# Patient Record
Sex: Male | Born: 1965 | Race: White | Hispanic: No | Marital: Married | Smoking: Never smoker
Health system: Southern US, Community
[De-identification: ages and names within clinical notes are randomized; demographics above are authoritative.]

## PROBLEM LIST (undated history)

## (undated) DIAGNOSIS — E785 Hyperlipidemia, unspecified: Secondary | ICD-10-CM

## (undated) DIAGNOSIS — H00019 Hordeolum externum unspecified eye, unspecified eyelid: Secondary | ICD-10-CM

## (undated) DIAGNOSIS — C4491 Basal cell carcinoma of skin, unspecified: Secondary | ICD-10-CM

## (undated) DIAGNOSIS — M419 Scoliosis, unspecified: Secondary | ICD-10-CM

## (undated) DIAGNOSIS — U071 COVID-19: Secondary | ICD-10-CM

## (undated) DIAGNOSIS — K649 Unspecified hemorrhoids: Secondary | ICD-10-CM

## (undated) DIAGNOSIS — T7840XA Allergy, unspecified, initial encounter: Secondary | ICD-10-CM

## (undated) HISTORY — PX: NASAL SINUS SURGERY: SHX719

## (undated) HISTORY — DX: Hyperlipidemia, unspecified: E78.5

## (undated) HISTORY — PX: FOOT SURGERY: SHX648

## (undated) HISTORY — DX: Hordeolum externum unspecified eye, unspecified eyelid: H00.019

## (undated) HISTORY — PX: HEMORRHOID SURGERY: SHX153

## (undated) HISTORY — DX: Allergy, unspecified, initial encounter: T78.40XA

## (undated) HISTORY — DX: Scoliosis, unspecified: M41.9

## (undated) HISTORY — DX: Unspecified hemorrhoids: K64.9

## (undated) HISTORY — DX: Basal cell carcinoma of skin, unspecified: C44.91

## (undated) HISTORY — DX: COVID-19: U07.1

---

## 2006-11-21 ENCOUNTER — Ambulatory Visit: Payer: Self-pay | Admitting: Surgery

## 2010-06-22 ENCOUNTER — Ambulatory Visit: Payer: Self-pay | Admitting: Otolaryngology

## 2012-05-14 DIAGNOSIS — D239 Other benign neoplasm of skin, unspecified: Secondary | ICD-10-CM

## 2012-05-14 HISTORY — DX: Other benign neoplasm of skin, unspecified: D23.9

## 2013-02-13 ENCOUNTER — Ambulatory Visit (INDEPENDENT_AMBULATORY_CARE_PROVIDER_SITE_OTHER): Payer: BC Managed Care – PPO | Admitting: Podiatry

## 2013-02-13 ENCOUNTER — Encounter: Payer: Self-pay | Admitting: Podiatry

## 2013-02-13 ENCOUNTER — Ambulatory Visit (INDEPENDENT_AMBULATORY_CARE_PROVIDER_SITE_OTHER): Payer: BC Managed Care – PPO

## 2013-02-13 VITALS — BP 123/85 | HR 73 | Resp 16 | Ht 73.0 in | Wt 190.0 lb

## 2013-02-13 DIAGNOSIS — M79609 Pain in unspecified limb: Secondary | ICD-10-CM

## 2013-02-13 DIAGNOSIS — M79671 Pain in right foot: Secondary | ICD-10-CM

## 2013-02-13 DIAGNOSIS — G588 Other specified mononeuropathies: Secondary | ICD-10-CM

## 2013-02-13 DIAGNOSIS — G5760 Lesion of plantar nerve, unspecified lower limb: Secondary | ICD-10-CM

## 2013-02-13 NOTE — Progress Notes (Signed)
   Subjective:    Patient ID: Kevin Ward, male    DOB: Jan 27, 1965, 48 y.o.   MRN: [id number]  HPI Comments: Been dealing with the neuroma in the right foot for some time now and would like to discuss options with dr Milinda Pointer , last seen was 10.17.2013      Review of Systems     Objective:   Physical Exam: I have reviewed his past medical history medications allergies surgeries and social history. Pulses are palpable right lower extremity. He has palpable Mulder's click to the third interdigital space of the right foot. No changes in his radiographs of the past. Normal osseous architecture.        Assessment & Plan:  Assessment: Neuroma third interdigital space right foot.  Plan: Discussed etiology pathology conservative versus surgical therapies. At this point we consented him today for an excision neuroma third interdigital space of the right foot. We went over consent form line bylined number by number giving him ample time to ask questions he saw for regarding this procedure I answered them to the best of my ability in layman's terms. He understands that he still may have pain postoperatively.

## 2013-03-15 ENCOUNTER — Encounter: Payer: Self-pay | Admitting: Podiatry

## 2013-03-15 DIAGNOSIS — G5760 Lesion of plantar nerve, unspecified lower limb: Secondary | ICD-10-CM

## 2013-03-18 ENCOUNTER — Telehealth: Payer: Self-pay | Admitting: *Deleted

## 2013-03-18 NOTE — Progress Notes (Signed)
1. Neurectomy 3rd (ids) interdigital space right foot  rx  Percocet 10/325 #50 0 refills one to two by mouth every 6 - 8 hrs as needed for pain Phenergan 25 mg #30 0 refills one by mouth every 6 - 8 hrs as needed for nausea Keflex 500 mg #20 0 refills one by mouth two times daily

## 2013-03-18 NOTE — Telephone Encounter (Signed)
Called and spoke with pt regarding surgery on 2.27.15. Pt states he is staying off of foot, elevating and icing also is taking all of his rx that dr Milinda Pointer wrote for. Told pt to continue doing what he is doing and we will see him this Thursday for his first post op visit. Pt understood.

## 2013-03-21 ENCOUNTER — Encounter: Payer: Self-pay | Admitting: Podiatry

## 2013-03-21 ENCOUNTER — Ambulatory Visit (INDEPENDENT_AMBULATORY_CARE_PROVIDER_SITE_OTHER): Payer: BC Managed Care – PPO | Admitting: Podiatry

## 2013-03-21 VITALS — BP 69/54 | HR 60 | Resp 16

## 2013-03-21 DIAGNOSIS — Z9889 Other specified postprocedural states: Secondary | ICD-10-CM

## 2013-03-21 DIAGNOSIS — G588 Other specified mononeuropathies: Secondary | ICD-10-CM

## 2013-03-21 DIAGNOSIS — G5760 Lesion of plantar nerve, unspecified lower limb: Secondary | ICD-10-CM

## 2013-03-21 NOTE — Progress Notes (Signed)
Post op 2.2715 right foot neurectomy. Pt states it is sore.  Objective: Vital signs are stable he is alert and oriented x3. He denies fever chills nausea vomiting muscle aches and pains. Dry sterile dressing was intact once removed demonstrates mild erythema around a incision overlying the third interdigital space of the right foot. The incision appears to be well coapted with the sutures are intact. He has minimal tenderness on range of motion of the toes #3 and #4 of the right foot. He has some numbness on palpation of the plantar surface of the foot and the toes do not appear to be numb to him. His pathology did come back today positive for neuroma.  Assessment: Status post 1 week excision neuroma third interdigital space of the right foot.  Plan: Redressed today with a dry sterile compressive dressing encouraged and remain in the [place] shoe and I will followup with him in one week for suture removal.

## 2013-03-28 ENCOUNTER — Ambulatory Visit: Payer: BC Managed Care – PPO | Admitting: Podiatry

## 2013-03-28 VITALS — BP 128/81 | HR 82 | Resp 16 | Ht 73.0 in | Wt 192.0 lb

## 2013-03-28 DIAGNOSIS — M79609 Pain in unspecified limb: Secondary | ICD-10-CM

## 2013-03-28 DIAGNOSIS — G588 Other specified mononeuropathies: Secondary | ICD-10-CM

## 2013-03-28 DIAGNOSIS — Z9889 Other specified postprocedural states: Secondary | ICD-10-CM

## 2013-03-28 DIAGNOSIS — G5760 Lesion of plantar nerve, unspecified lower limb: Secondary | ICD-10-CM

## 2013-03-29 NOTE — Progress Notes (Signed)
He presents today 2 weeks status post surgical neurectomy third interdigital space of the right foot. He denies fever chills nausea vomiting muscle aches and pains states it is doing pretty good.  Objective: Vital signs are stable he is alert and oriented x3 today sutures were removed there is no dehiscence is no signs of infection.  Assessment: Well-healing surgical foot right.  Plan: I would allow him to start getting this wet and into a regular shoe I will followup with him in 2 weeks.

## 2013-04-11 ENCOUNTER — Ambulatory Visit (INDEPENDENT_AMBULATORY_CARE_PROVIDER_SITE_OTHER): Payer: BC Managed Care – PPO | Admitting: Podiatry

## 2013-04-11 ENCOUNTER — Encounter: Payer: Self-pay | Admitting: Podiatry

## 2013-04-11 VITALS — BP 157/77 | HR 87 | Resp 14 | Ht 73.0 in | Wt 192.0 lb

## 2013-04-11 DIAGNOSIS — Z9889 Other specified postprocedural states: Secondary | ICD-10-CM

## 2013-04-11 DIAGNOSIS — G588 Other specified mononeuropathies: Secondary | ICD-10-CM

## 2013-04-11 DIAGNOSIS — G5760 Lesion of plantar nerve, unspecified lower limb: Secondary | ICD-10-CM

## 2013-04-11 NOTE — Progress Notes (Signed)
He presents today status post neurectomy third interdigital space of the right foot. This took place 4 weeks ago. He denies fever chills nausea vomiting muscle aches and pains states it really feels good.  Objective: Vital signs are stable he is alert and oriented x3. There is no erythema edema cellulitis drainage or odor. No tenderness on palpation.  Assessment: Well-healing surgical neurectomy third interdigital space right foot.  Plan: Discussed etiology pathology conservative versus surgical therapies I am going to release him from surgical care.

## 2013-04-22 ENCOUNTER — Encounter: Payer: Self-pay | Admitting: Podiatry

## 2014-04-07 ENCOUNTER — Ambulatory Visit: Payer: Self-pay | Admitting: Physician Assistant

## 2015-02-09 LAB — HM HIV SCREENING LAB: HM HIV Screening: NEGATIVE

## 2015-02-09 LAB — HM HEPATITIS C SCREENING LAB: HM Hepatitis Screen: NEGATIVE

## 2016-03-25 LAB — HM COLONOSCOPY

## 2016-06-21 DIAGNOSIS — H52203 Unspecified astigmatism, bilateral: Secondary | ICD-10-CM | POA: Diagnosis not present

## 2016-11-19 DIAGNOSIS — Z23 Encounter for immunization: Secondary | ICD-10-CM | POA: Diagnosis not present

## 2017-02-25 DIAGNOSIS — J019 Acute sinusitis, unspecified: Secondary | ICD-10-CM | POA: Diagnosis not present

## 2017-03-30 DIAGNOSIS — H52203 Unspecified astigmatism, bilateral: Secondary | ICD-10-CM | POA: Diagnosis not present

## 2017-08-15 DIAGNOSIS — M9903 Segmental and somatic dysfunction of lumbar region: Secondary | ICD-10-CM | POA: Diagnosis not present

## 2017-08-15 DIAGNOSIS — M9901 Segmental and somatic dysfunction of cervical region: Secondary | ICD-10-CM | POA: Diagnosis not present

## 2017-08-15 DIAGNOSIS — M9902 Segmental and somatic dysfunction of thoracic region: Secondary | ICD-10-CM | POA: Diagnosis not present

## 2017-08-15 DIAGNOSIS — M5412 Radiculopathy, cervical region: Secondary | ICD-10-CM | POA: Diagnosis not present

## 2017-09-22 DIAGNOSIS — M5412 Radiculopathy, cervical region: Secondary | ICD-10-CM | POA: Diagnosis not present

## 2017-09-22 DIAGNOSIS — M9902 Segmental and somatic dysfunction of thoracic region: Secondary | ICD-10-CM | POA: Diagnosis not present

## 2017-09-22 DIAGNOSIS — M9901 Segmental and somatic dysfunction of cervical region: Secondary | ICD-10-CM | POA: Diagnosis not present

## 2017-09-22 DIAGNOSIS — M9903 Segmental and somatic dysfunction of lumbar region: Secondary | ICD-10-CM | POA: Diagnosis not present

## 2017-10-12 DIAGNOSIS — M5412 Radiculopathy, cervical region: Secondary | ICD-10-CM | POA: Diagnosis not present

## 2017-10-12 DIAGNOSIS — M9903 Segmental and somatic dysfunction of lumbar region: Secondary | ICD-10-CM | POA: Diagnosis not present

## 2017-10-12 DIAGNOSIS — M9902 Segmental and somatic dysfunction of thoracic region: Secondary | ICD-10-CM | POA: Diagnosis not present

## 2017-10-12 DIAGNOSIS — M9901 Segmental and somatic dysfunction of cervical region: Secondary | ICD-10-CM | POA: Diagnosis not present

## 2017-10-20 DIAGNOSIS — M9902 Segmental and somatic dysfunction of thoracic region: Secondary | ICD-10-CM | POA: Diagnosis not present

## 2017-10-20 DIAGNOSIS — M9901 Segmental and somatic dysfunction of cervical region: Secondary | ICD-10-CM | POA: Diagnosis not present

## 2017-10-20 DIAGNOSIS — M5412 Radiculopathy, cervical region: Secondary | ICD-10-CM | POA: Diagnosis not present

## 2017-10-20 DIAGNOSIS — M9903 Segmental and somatic dysfunction of lumbar region: Secondary | ICD-10-CM | POA: Diagnosis not present

## 2017-11-03 DIAGNOSIS — M9902 Segmental and somatic dysfunction of thoracic region: Secondary | ICD-10-CM | POA: Diagnosis not present

## 2017-11-03 DIAGNOSIS — M9901 Segmental and somatic dysfunction of cervical region: Secondary | ICD-10-CM | POA: Diagnosis not present

## 2017-11-03 DIAGNOSIS — M5412 Radiculopathy, cervical region: Secondary | ICD-10-CM | POA: Diagnosis not present

## 2017-11-03 DIAGNOSIS — M9903 Segmental and somatic dysfunction of lumbar region: Secondary | ICD-10-CM | POA: Diagnosis not present

## 2017-11-17 DIAGNOSIS — M9902 Segmental and somatic dysfunction of thoracic region: Secondary | ICD-10-CM | POA: Diagnosis not present

## 2017-11-17 DIAGNOSIS — M9903 Segmental and somatic dysfunction of lumbar region: Secondary | ICD-10-CM | POA: Diagnosis not present

## 2017-11-17 DIAGNOSIS — M9901 Segmental and somatic dysfunction of cervical region: Secondary | ICD-10-CM | POA: Diagnosis not present

## 2017-11-17 DIAGNOSIS — M5412 Radiculopathy, cervical region: Secondary | ICD-10-CM | POA: Diagnosis not present

## 2017-12-22 DIAGNOSIS — M5412 Radiculopathy, cervical region: Secondary | ICD-10-CM | POA: Diagnosis not present

## 2017-12-22 DIAGNOSIS — M9902 Segmental and somatic dysfunction of thoracic region: Secondary | ICD-10-CM | POA: Diagnosis not present

## 2017-12-22 DIAGNOSIS — M9903 Segmental and somatic dysfunction of lumbar region: Secondary | ICD-10-CM | POA: Diagnosis not present

## 2017-12-22 DIAGNOSIS — M9901 Segmental and somatic dysfunction of cervical region: Secondary | ICD-10-CM | POA: Diagnosis not present

## 2018-01-19 DIAGNOSIS — M9903 Segmental and somatic dysfunction of lumbar region: Secondary | ICD-10-CM | POA: Diagnosis not present

## 2018-01-19 DIAGNOSIS — M9902 Segmental and somatic dysfunction of thoracic region: Secondary | ICD-10-CM | POA: Diagnosis not present

## 2018-01-19 DIAGNOSIS — M5412 Radiculopathy, cervical region: Secondary | ICD-10-CM | POA: Diagnosis not present

## 2018-01-19 DIAGNOSIS — M9901 Segmental and somatic dysfunction of cervical region: Secondary | ICD-10-CM | POA: Diagnosis not present

## 2018-02-12 DIAGNOSIS — M5412 Radiculopathy, cervical region: Secondary | ICD-10-CM | POA: Diagnosis not present

## 2018-02-12 DIAGNOSIS — M9902 Segmental and somatic dysfunction of thoracic region: Secondary | ICD-10-CM | POA: Diagnosis not present

## 2018-02-12 DIAGNOSIS — M9903 Segmental and somatic dysfunction of lumbar region: Secondary | ICD-10-CM | POA: Diagnosis not present

## 2018-02-12 DIAGNOSIS — M9901 Segmental and somatic dysfunction of cervical region: Secondary | ICD-10-CM | POA: Diagnosis not present

## 2018-03-14 ENCOUNTER — Ambulatory Visit: Payer: BLUE CROSS/BLUE SHIELD | Admitting: Internal Medicine

## 2018-03-14 ENCOUNTER — Encounter: Payer: Self-pay | Admitting: Internal Medicine

## 2018-03-14 VITALS — BP 128/82 | HR 63 | Temp 98.3°F | Ht 73.0 in | Wt 199.0 lb

## 2018-03-14 DIAGNOSIS — Z0000 Encounter for general adult medical examination without abnormal findings: Secondary | ICD-10-CM | POA: Diagnosis not present

## 2018-03-14 DIAGNOSIS — E785 Hyperlipidemia, unspecified: Secondary | ICD-10-CM | POA: Diagnosis not present

## 2018-03-14 DIAGNOSIS — K148 Other diseases of tongue: Secondary | ICD-10-CM

## 2018-03-14 DIAGNOSIS — Z125 Encounter for screening for malignant neoplasm of prostate: Secondary | ICD-10-CM

## 2018-03-14 DIAGNOSIS — Z1159 Encounter for screening for other viral diseases: Secondary | ICD-10-CM

## 2018-03-14 DIAGNOSIS — K219 Gastro-esophageal reflux disease without esophagitis: Secondary | ICD-10-CM

## 2018-03-14 DIAGNOSIS — E559 Vitamin D deficiency, unspecified: Secondary | ICD-10-CM

## 2018-03-14 DIAGNOSIS — Z85828 Personal history of other malignant neoplasm of skin: Secondary | ICD-10-CM

## 2018-03-14 DIAGNOSIS — Z1389 Encounter for screening for other disorder: Secondary | ICD-10-CM

## 2018-03-14 DIAGNOSIS — Z1329 Encounter for screening for other suspected endocrine disorder: Secondary | ICD-10-CM | POA: Diagnosis not present

## 2018-03-14 DIAGNOSIS — Z1283 Encounter for screening for malignant neoplasm of skin: Secondary | ICD-10-CM | POA: Diagnosis not present

## 2018-03-14 DIAGNOSIS — Z23 Encounter for immunization: Secondary | ICD-10-CM

## 2018-03-14 DIAGNOSIS — Z0184 Encounter for antibody response examination: Secondary | ICD-10-CM

## 2018-03-14 HISTORY — DX: Personal history of other malignant neoplasm of skin: Z85.828

## 2018-03-14 LAB — CBC WITH DIFFERENTIAL/PLATELET
Basophils Absolute: 0.1 10*3/uL (ref 0.0–0.1)
Basophils Relative: 0.8 % (ref 0.0–3.0)
Eosinophils Absolute: 0.1 10*3/uL (ref 0.0–0.7)
Eosinophils Relative: 2.2 % (ref 0.0–5.0)
HCT: 43.6 % (ref 39.0–52.0)
Hemoglobin: 15.1 g/dL (ref 13.0–17.0)
Lymphocytes Relative: 32.3 % (ref 12.0–46.0)
Lymphs Abs: 2.2 10*3/uL (ref 0.7–4.0)
MCHC: 34.7 g/dL (ref 30.0–36.0)
MCV: 88.1 fl (ref 78.0–100.0)
Monocytes Absolute: 0.5 10*3/uL (ref 0.1–1.0)
Monocytes Relative: 7.8 % (ref 3.0–12.0)
Neutro Abs: 3.8 10*3/uL (ref 1.4–7.7)
Neutrophils Relative %: 56.9 % (ref 43.0–77.0)
Platelets: 216 10*3/uL (ref 150.0–400.0)
RBC: 4.95 Mil/uL (ref 4.22–5.81)
RDW: 12.9 % (ref 11.5–15.5)
WBC: 6.7 10*3/uL (ref 4.0–10.5)

## 2018-03-14 LAB — LIPID PANEL
CHOL/HDL RATIO: 4
Cholesterol: 199 mg/dL (ref 0–200)
HDL: 45.7 mg/dL (ref >39.00)
LDL Cholesterol: 114 mg/dL — ABNORMAL HIGH (ref 0–99)
NONHDL: 152.95
Triglycerides: 197 mg/dL — ABNORMAL HIGH (ref 0.0–149.0)
VLDL: 39.4 mg/dL (ref 0.0–40.0)

## 2018-03-14 LAB — COMPREHENSIVE METABOLIC PANEL
ALT: 24 U/L (ref 0–53)
AST: 19 U/L (ref 0–37)
Albumin: 4.7 g/dL (ref 3.5–5.2)
Alkaline Phosphatase: 68 U/L (ref 39–117)
BILIRUBIN TOTAL: 0.6 mg/dL (ref 0.2–1.2)
BUN: 16 mg/dL (ref 6–23)
CO2: 31 mEq/L (ref 19–32)
Calcium: 10 mg/dL (ref 8.4–10.5)
Chloride: 101 mEq/L (ref 96–112)
Creatinine, Ser: 0.94 mg/dL (ref 0.40–1.50)
GFR: 84.22 mL/min (ref >60.00)
Glucose, Bld: 93 mg/dL (ref 70–99)
Potassium: 4.1 mEq/L (ref 3.5–5.1)
SODIUM: 140 meq/L (ref 135–145)
TOTAL PROTEIN: 7 g/dL (ref 6.0–8.3)

## 2018-03-14 LAB — URINALYSIS, ROUTINE W REFLEX MICROSCOPIC
Bilirubin Urine: NEGATIVE
Glucose, UA: NEGATIVE
Hgb urine dipstick: NEGATIVE
Ketones, ur: NEGATIVE
Leukocytes,Ua: NEGATIVE
Nitrite: NEGATIVE
Protein, ur: NEGATIVE
Specific Gravity, Urine: 1.011 (ref 1.001–1.03)
pH: 7 (ref 5.0–8.0)

## 2018-03-14 MED ORDER — PANTOPRAZOLE SODIUM 40 MG PO TBEC: 40.0000 mg | DELAYED_RELEASE_TABLET | Freq: Every day | ORAL | 1 refills | Status: DC

## 2018-03-14 NOTE — Patient Instructions (Addendum)
Shingrix vaccines   Cholesterol Cholesterol is a white, waxy, fat-like substance that is needed by the human body in small amounts. The liver makes all the cholesterol we need. Cholesterol is carried from the liver by the blood through the blood vessels. Deposits of cholesterol (plaques) may build up on blood vessel (artery) walls. Plaques make the arteries narrower and stiffer. Cholesterol plaques increase the risk for heart attack and stroke. You cannot feel your cholesterol level even if it is very high. The only way to know that it is high is to have a blood test. Once you know your cholesterol levels, you should keep a record of the test results. Work with your health care provider to keep your levels in the desired range. What do the results mean?  Total cholesterol is a rough measure of all the cholesterol in your blood.  LDL (low-density lipoprotein) is the "bad" cholesterol. This is the type that causes plaque to build up on the artery walls. You want this level to be low.  HDL (high-density lipoprotein) is the "good" cholesterol because it cleans the arteries and carries the LDL away. You want this level to be high.  Triglycerides are fat that the body can either burn for energy or store. High levels are closely linked to heart disease. What are the desired levels of cholesterol?  Total cholesterol below 200.  LDL below 100 for people who are at risk, below 70 for people at very high risk.  HDL above 40 is good. A level of 60 or higher is considered to be protective against heart disease.  Triglycerides below 150. How can I lower my cholesterol? Diet Follow your diet program as told by your health care provider.  Choose fish or white meat chicken and [place], roasted or baked. Limit fatty cuts of red meat, fried foods, and processed meats, such as sausage and lunch meats.  Eat lots of fresh fruits and vegetables.  Choose whole grains, beans, pasta, potatoes, and  cereals.  Choose olive oil, corn oil, or canola oil, and use only small amounts.  Avoid butter, mayonnaise, shortening, or palm kernel oils.  Avoid foods with trans fats.  Drink skim or nonfat milk and eat low-fat or nonfat yogurt and cheeses. Avoid whole milk, cream, ice cream, egg yolks, and full-fat cheeses.  Healthier desserts include angel food cake, ginger snaps, animal crackers, hard candy, popsicles, and low-fat or nonfat frozen yogurt. Avoid pastries, cakes, pies, and cookies.  Exercise  Follow your exercise program as told by your health care provider. A regular program: ? Helps to decrease LDL and raise HDL. ? Helps with weight control.  Do things that increase your activity level, such as gardening, walking, and taking the stairs.  Ask your health care provider about ways that you can be more active in your daily life. Medicine  Take over-the-counter and prescription medicines only as told by your health care provider. ? Medicine may be prescribed by your health care provider to help lower cholesterol and decrease the risk for heart disease. This is usually done if diet and exercise have failed to bring down cholesterol levels. ? If you have several risk factors, you may need medicine even if your levels are normal. This information is not intended to replace advice given to you by your health care provider. Make sure you discuss any questions you have with your health care provider. Document Released: 09/28/2000 Document Revised: 08/01/2015 Document Reviewed: 07/04/2015 Elsevier Interactive Patient Education  [employer].  Gastroesophageal Reflux Disease, Adult Gastroesophageal reflux (GER) happens when acid from the stomach flows up into the tube that connects the mouth and the stomach (esophagus). Normally, food travels down the esophagus and stays in the stomach to be digested. However, when a person has GER, food and stomach acid sometimes move back up into  the esophagus. If this becomes a more serious problem, the person may be diagnosed with a disease called gastroesophageal reflux disease (GERD). GERD occurs when the reflux:  Happens often.  Causes frequent or severe symptoms.  Causes problems such as damage to the esophagus. When stomach acid comes in contact with the esophagus, the acid may cause soreness (inflammation) in the esophagus. Over time, GERD may create small holes (ulcers) in the lining of the esophagus. What are the causes? This condition is caused by a problem with the muscle between the esophagus and the stomach (lower esophageal sphincter, or LES). Normally, the LES muscle closes after food passes through the esophagus to the stomach. When the LES is weakened or abnormal, it does not close properly, and that allows food and stomach acid to go back up into the esophagus. The LES can be weakened by certain dietary substances, medicines, and medical conditions, including:  Tobacco use.  Pregnancy.  Having a hiatal hernia.  Alcohol use.  Certain foods and beverages, such as coffee, chocolate, onions, and peppermint. What increases the risk? You are more likely to develop this condition if you:  Have an increased body weight.  Have a connective tissue disorder.  Use NSAID medicines. What are the signs or symptoms? Symptoms of this condition include:  Heartburn.  Difficult or painful swallowing.  The feeling of having a lump in the throat.  Abitter taste in the mouth.  Bad breath.  Having a large amount of saliva.  Having an upset or bloated stomach.  Belching.  Chest pain. Different conditions can cause chest pain. Make sure you see your health care provider if you experience chest pain.  Shortness of breath or wheezing.  Ongoing (chronic) cough or a night-time cough.  Wearing away of tooth enamel.  Weight loss. How is this diagnosed? Your health care provider will take a medical history and  perform a physical exam. To determine if you have mild or severe GERD, your health care provider may also monitor how you respond to treatment. You may also have tests, including:  A test to examine your stomach and esophagus with a small camera (endoscopy).  A test thatmeasures the acidity level in your esophagus.  A test thatmeasures how much pressure is on your esophagus.  A barium swallow or modified barium swallow test to show the shape, size, and functioning of your esophagus. How is this treated? The goal of treatment is to help relieve your symptoms and to prevent complications. Treatment for this condition may vary depending on how severe your symptoms are. Your health care provider may recommend:  Changes to your diet.  Medicine.  Surgery. Follow these instructions at home: Eating and drinking   Follow a diet as recommended by your health care provider. This may involve avoiding foods and drinks such as: ? Coffee and tea (with or without caffeine). ? Drinks that containalcohol. ? Energy drinks and sports drinks. ? Carbonated drinks or sodas. ? Chocolate and cocoa. ? Peppermint and mint flavorings. ? Garlic and onions. ? Horseradish. ? Spicy and acidic foods, including peppers, chili powder, curry powder, vinegar, hot sauces, and barbecue sauce. ? Citrus fruit juices and  citrus fruits, such as oranges, lemons, and limes. ? Tomato-based foods, such as red sauce, chili, salsa, and pizza with red sauce. ? Fried and fatty foods, such as donuts, french fries, potato chips, and high-fat dressings. ? High-fat meats, such as hot dogs and fatty cuts of red and white meats, such as rib eye steak, sausage, ham, and bacon. ? High-fat dairy items, such as whole milk, butter, and cream cheese.  Eat small, frequent meals instead of large meals.  Avoid drinking large amounts of liquid with your meals.  Avoid eating meals during the 2-3 hours before bedtime.  Avoid lying down  right after you eat.  Do not exercise right after you eat. Lifestyle   Do not use any products that contain nicotine or tobacco, such as cigarettes, e-cigarettes, and chewing tobacco. If you need help quitting, ask your health care provider.  Try to reduce your stress by using methods such as yoga or meditation. If you need help reducing stress, ask your health care provider.  If you are overweight, reduce your weight to an amount that is healthy for you. Ask your health care provider for guidance about a safe weight loss goal. General instructions  Pay attention to any changes in your symptoms.  Take over-the-counter and prescription medicines only as told by your health care provider. Do not take aspirin, ibuprofen, or other NSAIDs unless your health care provider told you to do so.  Wear loose-fitting clothing. Do not wear anything tight around your waist that causes pressure on your abdomen.  Raise (elevate) the head of your bed about 6 inches (15 cm).  Avoid bending over if this makes your symptoms worse.  Keep all follow-up visits as told by your health care provider. This is important. Contact a health care provider if:  You have: ? New symptoms. ? Unexplained weight loss. ? Difficulty swallowing or it hurts to swallow. ? Wheezing or a persistent cough. ? A hoarse voice.  Your symptoms do not improve with treatment. Get help right away if you:  Have pain in your arms, neck, jaw, teeth, or back.  Feel sweaty, dizzy, or light-headed.  Have chest pain or shortness of breath.  Vomit and your vomit looks like blood or coffee grounds.  Faint.  Have stool that is bloody or black.  Cannot swallow, drink, or eat. Summary  Gastroesophageal reflux happens when acid from the stomach flows up into the esophagus. GERD is a disease in which the reflux happens often, causes frequent or severe symptoms, or causes problems such as damage to the esophagus.  Treatment for this  condition may vary depending on how severe your symptoms are. Your health care provider may recommend diet and lifestyle changes, medicine, or surgery.  Contact a health care provider if you have new or worsening symptoms.  Take over-the-counter and prescription medicines only as told by your health care provider. Do not take aspirin, ibuprofen, or other NSAIDs unless your health care provider told you to do so.  Keep all follow-up visits as told by your health care provider. This is important. This information is not intended to replace advice given to you by your health care provider. Make sure you discuss any questions you have with your health care provider. Document Released: 10/13/2004 Document Revised: 07/12/2017 Document Reviewed: 07/12/2017 Elsevier Interactive Patient Education  2019 [place] for Gastroesophageal Reflux Disease, Adult When you have gastroesophageal reflux disease (GERD), the foods you eat and your eating habits are  very important. Choosing the right foods can help ease your discomfort. Think about working with a nutrition specialist (dietitian) to help you make good choices. What are tips for following this plan?  Meals  Choose healthy foods that are low in fat, such as fruits, vegetables, whole grains, low-fat dairy products, and lean meat, fish, and poultry.  Eat small meals often instead of 3 large meals a day. Eat your meals slowly, and in a place where you are relaxed. Avoid bending over or lying down until 2-3 hours after eating.  Avoid eating meals 2-3 hours before bed.  Avoid drinking a lot of liquid with meals.  Cook foods using methods other than frying. Bake, grill, or broil food instead.  Avoid or limit: ? Chocolate. ? Peppermint or spearmint. ? Alcohol. ? Pepper. ? Black and decaffeinated coffee. ? Black and decaffeinated tea. ? Bubbly (carbonated) soft drinks. ? Caffeinated energy drinks and soft drinks.  Limit  high-fat foods such as: ? Fatty meat or fried foods. ? Whole milk, cream, butter, or ice cream. ? Nuts and nut butters. ? Pastries, donuts, and sweets made with butter or shortening.  Avoid foods that cause symptoms. These foods may be different for everyone. Common foods that cause symptoms include: ? Tomatoes. ? Oranges, lemons, and limes. ? Peppers. ? Spicy food. ? Onions and garlic. ? Vinegar. Lifestyle  Maintain a healthy weight. Ask your doctor what weight is healthy for you. If you need to lose weight, work with your doctor to do so safely.  Exercise for at least 30 minutes for 5 or more days each week, or as told by your doctor.  Wear loose-fitting clothes.  Do not smoke. If you need help quitting, ask your doctor.  Sleep with the head of your bed higher than your feet. Use a wedge under the mattress or blocks under the bed frame to raise the head of the bed. Summary  When you have gastroesophageal reflux disease (GERD), food and lifestyle choices are very important in easing your symptoms.  Eat small meals often instead of 3 large meals a day. Eat your meals slowly, and in a place where you are relaxed.  Limit high-fat foods such as fatty meat or fried foods.  Avoid bending over or lying down until 2-3 hours after eating.  Avoid peppermint and spearmint, caffeine, alcohol, and chocolate. This information is not intended to replace advice given to you by your health care provider. Make sure you discuss any questions you have with your health care provider. Document Released: 07/05/2011 Document Revised: 02/09/2016 Document Reviewed: 02/09/2016 Elsevier Interactive Patient Education  2019 [employer]   Recombinant Zoster (Shingles) Vaccine, RZV: What You Need to Know 1. Why get vaccinated? Shingles (also called herpes zoster, or just zoster) is a painful skin rash, often with blisters. Shingles is caused by the varicella zoster virus, the same virus that causes  chickenpox. After you have chickenpox, the virus stays in your body and can cause shingles later in life. You can't catch shingles from another person. However, a person who has never had chickenpox (or chickenpox vaccine) could get chickenpox from someone with shingles. A shingles rash usually appears on one side of the face or body and heals within 2 to 4 weeks. Its main symptom is pain, which can be severe. Other symptoms can include fever, headache, chills, and upset stomach. Very rarely, a shingles infection can lead to pneumonia, hearing problems, blindness, brain inflammation (encephalitis), or death. For about 1 person  in 5, severe pain can continue even long after the rash has cleared up. This long-lasting pain is called post-herpetic neuralgia (PHN). Shingles is far more common in people 43 years of age and older than in younger people, and the risk increases with age. It is also more common in people whose immune system is weakened because of a disease such as cancer, or by drugs such as steroids or chemotherapy. At least 1 million people a year in the [place] get shingles. 2. Shingles vaccine (recombinant) Recombinant shingles vaccine was approved by FDA in 2017 for the prevention of shingles. In clinical trials, it was more than 90% effective in preventing shingles. It can also reduce the likelihood of PHN. Two doses, 2 to 6 months apart, are recommended for adults 45 and older. This vaccine is also recommended for people who have already gotten the live shingles vaccine (Zostavax). There is no live virus in this vaccine. 3. Some people should not get this vaccine Tell your vaccine provider if you:  Have any severe, life-threatening allergies. A person who has ever had a life-threatening allergic reaction after a dose of recombinant shingles vaccine, or has a severe allergy to any component of this vaccine, may be advised not to be vaccinated. Ask your health care provider if you want  information about vaccine components.  Are pregnant or breastfeeding. There is not much information about use of recombinant shingles vaccine in pregnant or nursing women. Your healthcare provider might recommend delaying vaccination.  Are not feeling well. If you have a mild illness, such as a cold, you can probably get the vaccine today. If you are moderately or severely ill, you should probably wait until you recover. Your doctor can advise you. 4. Risks of a vaccine reaction With any medicine, including vaccines, there is a chance of reactions. After recombinant shingles vaccination, a person might experience:  Pain, redness, soreness, or swelling at the site of the injection  Headache, muscle aches, fever, shivering, fatigue In clinical trials, most people got a sore arm with mild or moderate pain after vaccination, and some also had redness and swelling where they got the shot. Some people felt tired, had muscle pain, a headache, shivering, fever, stomach pain, or nausea. About 1 out of 6 people who got recombinant zoster vaccine experienced side effects that prevented them from doing regular activities. Symptoms went away on their own in about 2 to 3 days. Side effects were more common in younger people. You should still get the second dose of recombinant zoster vaccine even if you had one of these reactions after the first dose. Other things that could happen after this vaccine:  People sometimes faint after medical procedures, including vaccination. Sitting or lying down for about 15 minutes can help prevent fainting and injuries caused by a fall. Tell your provider if you feel dizzy or have vision changes or ringing in the ears.  Some people get shoulder pain that can be more severe and longer-lasting than routine soreness that can follow injections. This happens very rarely.  Any medication can cause a severe allergic reaction. Such reactions to a vaccine are estimated at about 1 in a  million doses, and would happen within a few minutes to a few hours after the vaccination. As with any medicine, there is a very remote chance of a vaccine causing a serious injury or death. The safety of vaccines is always being monitored. For more information, visit: http://www.aguilar.org/ 5. What if there is a  serious problem? What should I look for?  Look for anything that concerns you, such as signs of a severe allergic reaction, very high fever, or unusual behavior. Signs of a severe allergic reaction can include hives, swelling of the face and throat, difficulty breathing, a fast heartbeat, dizziness, and weakness. These would usually start a few minutes to a few hours after the vaccination. What should I do?  If you think it is a severe allergic reaction or other emergency that can't wait, call 9-1-1 or get to the nearest hospital. Otherwise, call your health care provider. Afterward, the reaction should be reported to the Vaccine Adverse Event Reporting System (VAERS). Your doctor should file this report, or you can do it yourself through the VAERS website at www.vaers.SamedayNews.es, or by calling 270-850-1992. VAERS does not give medical advice. 6. How can I learn more?  Ask your health care provider. He or she can give you the vaccine package insert or suggest other sources of information.  Call your local or state health department.  Contact the Centers for Disease Control and Prevention (CDC): ? Call (365) 203-9709 (1-800-CDC-INFO) or ? Visit CDC's vaccines website at http://hunter.com/ CDC Vaccine Information Statement Recombinant Zoster Vaccine (02/29/2016) This information is not intended to replace advice given to you by your health care provider. Make sure you discuss any questions you have with your health care provider. Document Released: 03/15/2016 Document Revised: 08/09/2017 Document Reviewed: 08/09/2017 Elsevier Interactive Patient Education  2019 [employer].

## 2018-03-14 NOTE — Progress Notes (Signed)
Chief Complaint  Patient presents with  . Establish Care   New patient former [facility] and [facility]  1. HLD no labs since 2017 changed diet not currently exercising older brother with HLD  2. C/o increased GERD over the last 6 months nothing tried other than pepcid w/o help. doing spicy foods and tea. He also c/o yellow green phelgm at times. He is belching/choking at night and burning in throat 2x w/in the last year. No pain with swallowing or dysphagia 3. C/o left tongue lateral sore x 2 weeks appt with dentist in 2 weeks former dip quit 15 years ago    Review of Systems  Constitutional: Negative for weight loss.  HENT: Negative for hearing loss.        Left tongue sore x 2 weeks   Eyes: Negative for blurred vision.  Respiratory: Negative for shortness of breath.   Cardiovascular: Negative for chest pain.  Gastrointestinal: Positive for heartburn.  Musculoskeletal: Negative for falls.  Skin: Negative for rash.  Neurological: Negative for headaches.  Psychiatric/Behavioral: Negative for depression.   Past Medical History:  Diagnosis Date  . Allergy    declined allergy shots in past used allergy drops not helpful; mold, grasses, dust  . Basal cell carcinoma    right eyebrow  . Hyperlipidemia    Past Surgical History:  Procedure Laterality Date  . FOOT SURGERY     [place] neuroma right foot Dr. Milinda Pointer 2015 and left foot 15-20 years prior had neuropathy sx's  . HEMORRHOID SURGERY    . NASAL SINUS SURGERY     [facility]   Family History  Problem Relation Age of Onset  . Cancer Mother        breast  . Hypertension Mother   . Diabetes Mother   . Alcohol abuse Father   . Hyperlipidemia Brother   . Depression Son   . Cancer Brother        kidney cancer   Social History   Socioeconomic History  . Marital status: Married    Spouse name: Not on file  . Number of children: Not on file  . Years of education: Not on file  . Highest education level: Not on  file  Occupational History  . Not on file  Social Needs  . Financial resource strain: Not on file  . Food insecurity:    Worry: Not on file    Inability: Not on file  . Transportation needs:    Medical: Not on file    Non-medical: Not on file  Tobacco Use  . Smoking status: Never Smoker  . Smokeless tobacco: Former Systems developer  . Tobacco comment: former snuff 15 years ago from 02/2018 occasion.  Substance and Sexual Activity  . Alcohol use: No  . Drug use: No  . Sexual activity: Yes    Comment: wife  Lifestyle  . Physical activity:    Days per week: Not on file    Minutes per session: Not on file  . Stress: Not on file  Relationships  . Social connections:    Talks on phone: Not on file    Gets together: Not on file    Attends religious service: Not on file    Active member of club or organization: Not on file    Attends meetings of clubs or organizations: Not on file    Relationship status: Not on file  . Intimate partner violence:    Fear of current or ex partner: Not on  file    Emotionally abused: Not on file    Physically abused: Not on file    Forced sexual activity: Not on file  Other Topics Concern  . Not on file  Social History Narrative   Married    2 daughters and 1 son    Museum/gallery exhibitions officer. Grad HS   Owns guns    Wears seat belt   Safe in relationship    Current Meds  Medication Sig  . Multiple Vitamin (MULTI-VITAMIN DAILY PO) Take by mouth.  Marland Kitchen UNABLE TO FIND Med Name: doterra supplement   No Known Allergies No results found for this or any previous visit (from the past 2160 hour(s)). Objective  Body mass index is 26.25 kg/m. Wt Readings from Last 3 Encounters:  03/14/18 199 lb (90.3 kg)  04/11/13 192 lb (87.1 kg)  03/28/13 192 lb (87.1 kg)   Temp Readings from Last 3 Encounters:  03/14/18 98.3 F (36.8 C) (Oral)   BP Readings from Last 3 Encounters:  03/14/18 128/82  04/11/13 (!) 157/77  03/28/13 128/81   Pulse Readings from Last 3  Encounters:  03/14/18 63  04/11/13 87  03/28/13 82    Physical Exam Vitals signs and nursing note reviewed.  Constitutional:      Appearance: Normal appearance. He is well-developed and well-groomed.  HENT:     Head: Normocephalic and atraumatic.     Nose: Nose normal.     Mouth/Throat:     Mouth: Mucous membranes are moist.     Pharynx: Oropharynx is clear.   Eyes:     Conjunctiva/sclera: Conjunctivae normal.     Pupils: Pupils are equal, round, and reactive to light.  Cardiovascular:     Rate and Rhythm: Normal rate and regular rhythm.     Heart sounds: Normal heart sounds. No murmur.  Pulmonary:     Effort: Pulmonary effort is normal.     Breath sounds: Normal breath sounds.  Skin:    General: Skin is warm and dry.  Neurological:     General: No focal deficit present.     Mental Status: He is alert and oriented to person, place, and time. Mental status is at baseline.     Gait: Gait normal.  Psychiatric:        Attention and Perception: Attention and perception normal.        Mood and Affect: Mood and affect normal.        Speech: Speech normal.        Behavior: Behavior normal. Behavior is cooperative.        Thought Content: Thought content normal.        Cognition and Memory: Cognition and memory normal.        Judgment: Judgment normal.     Assessment   1. hld  2. gerd  3. Left tongue lesion  4. HM Plan   1. Check fasting labs today  rec exercise given cholesterol info  2. Trial of protonix if not helping refer GI EGD 3. F/u with dentist in 2 weeks  4.  Fasting labs today Flu shot had 10/2017 work  Tdap given today  Disc shingrix vaccine today  Check MMR, hep B status  Hep C, HIV neg 02/09/15   PSA check today physical and DRE at f/u  Colonoscopy had 03/25/16 normal  H/o BCC right forehead refer to Dr. Raliegh Ip f/u been years  Eye Dr. Ellin Mayhew  Dentist Dr. Kerri Perches  [facility]  Provider: Dr. Olivia Mackie McLean-[facility]

## 2018-03-14 NOTE — Progress Notes (Signed)
Pre visit review using our clinic review tool, if applicable. No additional management support is needed unless otherwise documented below in the visit note. 

## 2018-03-15 LAB — PSA: PSA: 0.68 ng/mL (ref 0.10–4.00)

## 2018-03-15 LAB — MEASLES/MUMPS/RUBELLA IMMUNITY
Mumps IgG: 178 AU/mL
Rubella: >33 index
Rubeola IgG: <13.5 AU/mL — ABNORMAL LOW

## 2018-03-15 LAB — VITAMIN D 25 HYDROXY (VIT D DEFICIENCY, FRACTURES): VITD: 34.74 ng/mL (ref 30.00–100.00)

## 2018-03-15 LAB — TSH: TSH: 2.08 u[IU]/mL (ref 0.35–4.50)

## 2018-03-15 LAB — T4, FREE: Free T4: 0.78 ng/dL (ref 0.60–1.60)

## 2018-03-15 LAB — HEPATITIS B SURFACE ANTIBODY, QUANTITATIVE: Hep B S AB Quant (Post): <5 m[IU]/mL — ABNORMAL LOW (ref >=10)

## 2018-03-19 DIAGNOSIS — M9902 Segmental and somatic dysfunction of thoracic region: Secondary | ICD-10-CM | POA: Diagnosis not present

## 2018-03-19 DIAGNOSIS — M9901 Segmental and somatic dysfunction of cervical region: Secondary | ICD-10-CM | POA: Diagnosis not present

## 2018-03-19 DIAGNOSIS — M5412 Radiculopathy, cervical region: Secondary | ICD-10-CM | POA: Diagnosis not present

## 2018-03-19 DIAGNOSIS — M9903 Segmental and somatic dysfunction of lumbar region: Secondary | ICD-10-CM | POA: Diagnosis not present

## 2018-04-16 DIAGNOSIS — M9903 Segmental and somatic dysfunction of lumbar region: Secondary | ICD-10-CM | POA: Diagnosis not present

## 2018-04-16 DIAGNOSIS — M5412 Radiculopathy, cervical region: Secondary | ICD-10-CM | POA: Diagnosis not present

## 2018-04-16 DIAGNOSIS — M9901 Segmental and somatic dysfunction of cervical region: Secondary | ICD-10-CM | POA: Diagnosis not present

## 2018-04-16 DIAGNOSIS — M9902 Segmental and somatic dysfunction of thoracic region: Secondary | ICD-10-CM | POA: Diagnosis not present

## 2018-04-25 DIAGNOSIS — Z1283 Encounter for screening for malignant neoplasm of skin: Secondary | ICD-10-CM | POA: Diagnosis not present

## 2018-04-25 DIAGNOSIS — D485 Neoplasm of uncertain behavior of skin: Secondary | ICD-10-CM | POA: Diagnosis not present

## 2018-04-25 DIAGNOSIS — Z85828 Personal history of other malignant neoplasm of skin: Secondary | ICD-10-CM | POA: Diagnosis not present

## 2018-04-25 DIAGNOSIS — L578 Other skin changes due to chronic exposure to nonionizing radiation: Secondary | ICD-10-CM | POA: Diagnosis not present

## 2018-05-14 DIAGNOSIS — M5412 Radiculopathy, cervical region: Secondary | ICD-10-CM | POA: Diagnosis not present

## 2018-05-14 DIAGNOSIS — M9903 Segmental and somatic dysfunction of lumbar region: Secondary | ICD-10-CM | POA: Diagnosis not present

## 2018-05-14 DIAGNOSIS — M9901 Segmental and somatic dysfunction of cervical region: Secondary | ICD-10-CM | POA: Diagnosis not present

## 2018-05-14 DIAGNOSIS — M9902 Segmental and somatic dysfunction of thoracic region: Secondary | ICD-10-CM | POA: Diagnosis not present

## 2018-06-12 DIAGNOSIS — M9902 Segmental and somatic dysfunction of thoracic region: Secondary | ICD-10-CM | POA: Diagnosis not present

## 2018-06-12 DIAGNOSIS — M9903 Segmental and somatic dysfunction of lumbar region: Secondary | ICD-10-CM | POA: Diagnosis not present

## 2018-06-12 DIAGNOSIS — M9901 Segmental and somatic dysfunction of cervical region: Secondary | ICD-10-CM | POA: Diagnosis not present

## 2018-06-12 DIAGNOSIS — M5412 Radiculopathy, cervical region: Secondary | ICD-10-CM | POA: Diagnosis not present

## 2018-06-15 ENCOUNTER — Other Ambulatory Visit: Payer: Self-pay

## 2018-06-15 ENCOUNTER — Ambulatory Visit (INDEPENDENT_AMBULATORY_CARE_PROVIDER_SITE_OTHER): Payer: BLUE CROSS/BLUE SHIELD | Admitting: Internal Medicine

## 2018-06-15 DIAGNOSIS — Z0000 Encounter for general adult medical examination without abnormal findings: Secondary | ICD-10-CM | POA: Diagnosis not present

## 2018-06-15 DIAGNOSIS — J309 Allergic rhinitis, unspecified: Secondary | ICD-10-CM | POA: Insufficient documentation

## 2018-06-15 DIAGNOSIS — K148 Other diseases of tongue: Secondary | ICD-10-CM | POA: Diagnosis not present

## 2018-06-15 DIAGNOSIS — Z125 Encounter for screening for malignant neoplasm of prostate: Secondary | ICD-10-CM

## 2018-06-15 DIAGNOSIS — Z1389 Encounter for screening for other disorder: Secondary | ICD-10-CM

## 2018-06-15 DIAGNOSIS — K219 Gastro-esophageal reflux disease without esophagitis: Secondary | ICD-10-CM | POA: Diagnosis not present

## 2018-06-15 DIAGNOSIS — E785 Hyperlipidemia, unspecified: Secondary | ICD-10-CM

## 2018-06-15 DIAGNOSIS — Z1329 Encounter for screening for other suspected endocrine disorder: Secondary | ICD-10-CM

## 2018-06-15 HISTORY — DX: Encounter for general adult medical examination without abnormal findings: Z00.00

## 2018-06-15 MED ORDER — IPRATROPIUM BROMIDE 0.06 % NA SOLN: 2.0000 | Freq: Three times a day (TID) | NASAL | 12 refills | Status: DC

## 2018-06-15 NOTE — Patient Instructions (Addendum)
We discussed as needed tums or pepcid for heartburn  Please wear a mask   Try atrovent nasal spray as needed for post nasal drip   I recommend vitamin D3 2000 IU daily    Gastroesophageal Reflux Disease, Adult Gastroesophageal reflux (GER) happens when acid from the stomach flows up into the tube that connects the mouth and the stomach (esophagus). Normally, food travels down the esophagus and stays in the stomach to be digested. However, when a person has GER, food and stomach acid sometimes move back up into the esophagus. If this becomes a more serious problem, the person may be diagnosed with a disease called gastroesophageal reflux disease (GERD). GERD occurs when the reflux:  Happens often.  Causes frequent or severe symptoms.  Causes problems such as damage to the esophagus. When stomach acid comes in contact with the esophagus, the acid may cause soreness (inflammation) in the esophagus. Over time, GERD may create small holes (ulcers) in the lining of the esophagus. What are the causes? This condition is caused by a problem with the muscle between the esophagus and the stomach (lower esophageal sphincter, or LES). Normally, the LES muscle closes after food passes through the esophagus to the stomach. When the LES is weakened or abnormal, it does not close properly, and that allows food and stomach acid to go back up into the esophagus. The LES can be weakened by certain dietary substances, medicines, and medical conditions, including:  Tobacco use.  Pregnancy.  Having a hiatal hernia.  Alcohol use.  Certain foods and beverages, such as coffee, chocolate, onions, and peppermint. What increases the risk? You are more likely to develop this condition if you:  Have an increased body weight.  Have a connective tissue disorder.  Use NSAID medicines. What are the signs or symptoms? Symptoms of this condition include:  Heartburn.  Difficult or painful swallowing.  The  feeling of having a lump in the throat.  Abitter taste in the mouth.  Bad breath.  Having a large amount of saliva.  Having an upset or bloated stomach.  Belching.  Chest pain. Different conditions can cause chest pain. Make sure you see your health care provider if you experience chest pain.  Shortness of breath or wheezing.  Ongoing (chronic) cough or a night-time cough.  Wearing away of tooth enamel.  Weight loss. How is this diagnosed? Your health care provider will take a medical history and perform a physical exam. To determine if you have mild or severe GERD, your health care provider may also monitor how you respond to treatment. You may also have tests, including:  A test to examine your stomach and esophagus with a small camera (endoscopy).  A test thatmeasures the acidity level in your esophagus.  A test thatmeasures how much pressure is on your esophagus.  A barium swallow or modified barium swallow test to show the shape, size, and functioning of your esophagus. How is this treated? The goal of treatment is to help relieve your symptoms and to prevent complications. Treatment for this condition may vary depending on how severe your symptoms are. Your health care provider may recommend:  Changes to your diet.  Medicine.  Surgery. Follow these instructions at home: Eating and drinking   Follow a diet as recommended by your health care provider. This may involve avoiding foods and drinks such as: ? Coffee and tea (with or without caffeine). ? Drinks that containalcohol. ? Energy drinks and sports drinks. ? Carbonated drinks or sodas. ?  Chocolate and cocoa. ? Peppermint and mint flavorings. ? Garlic and onions. ? Horseradish. ? Spicy and acidic foods, including peppers, chili powder, curry powder, vinegar, hot sauces, and barbecue sauce. ? Citrus fruit juices and citrus fruits, such as oranges, lemons, and limes. ? Tomato-based foods, such as red  sauce, chili, salsa, and pizza with red sauce. ? Fried and fatty foods, such as donuts, french fries, potato chips, and high-fat dressings. ? High-fat meats, such as hot dogs and fatty cuts of red and white meats, such as rib eye steak, sausage, ham, and bacon. ? High-fat dairy items, such as whole milk, butter, and cream cheese.  Eat small, frequent meals instead of large meals.  Avoid drinking large amounts of liquid with your meals.  Avoid eating meals during the 2-3 hours before bedtime.  Avoid lying down right after you eat.  Do not exercise right after you eat. Lifestyle   Do not use any products that contain nicotine or tobacco, such as cigarettes, e-cigarettes, and chewing tobacco. If you need help quitting, ask your health care provider.  Try to reduce your stress by using methods such as yoga or meditation. If you need help reducing stress, ask your health care provider.  If you are overweight, reduce your weight to an amount that is healthy for you. Ask your health care provider for guidance about a safe weight loss goal. General instructions  Pay attention to any changes in your symptoms.  Take over-the-counter and prescription medicines only as told by your health care provider. Do not take aspirin, ibuprofen, or other NSAIDs unless your health care provider told you to do so.  Wear loose-fitting clothing. Do not wear anything tight around your waist that causes pressure on your abdomen.  Raise (elevate) the head of your bed about 6 inches (15 cm).  Avoid bending over if this makes your symptoms worse.  Keep all follow-up visits as told by your health care provider. This is important. Contact a health care provider if:  You have: ? New symptoms. ? Unexplained weight loss. ? Difficulty swallowing or it hurts to swallow. ? Wheezing or a persistent cough. ? A hoarse voice.  Your symptoms do not improve with treatment. Get help right away if you:  Have pain in  your arms, neck, jaw, teeth, or back.  Feel sweaty, dizzy, or light-headed.  Have chest pain or shortness of breath.  Vomit and your vomit looks like blood or coffee grounds.  Faint.  Have stool that is bloody or black.  Cannot swallow, drink, or eat. Summary  Gastroesophageal reflux happens when acid from the stomach flows up into the esophagus. GERD is a disease in which the reflux happens often, causes frequent or severe symptoms, or causes problems such as damage to the esophagus.  Treatment for this condition may vary depending on how severe your symptoms are. Your health care provider may recommend diet and lifestyle changes, medicine, or surgery.  Contact a health care provider if you have new or worsening symptoms.  Take over-the-counter and prescription medicines only as told by your health care provider. Do not take aspirin, ibuprofen, or other NSAIDs unless your health care provider told you to do so.  Keep all follow-up visits as told by your health care provider. This is important. This information is not intended to replace advice given to you by your health care provider. Make sure you discuss any questions you have with your health care provider. Document Released: 10/13/2004 Document Revised: 07/12/2017  Document Reviewed: 07/12/2017 Elsevier Interactive Patient Education  2019 [employer] for Gastroesophageal Reflux Disease, Adult When you have gastroesophageal reflux disease (GERD), the foods you eat and your eating habits are very important. Choosing the right foods can help ease the discomfort of GERD. Consider working with a diet and nutrition specialist (dietitian) to help you make healthy food choices. What general guidelines should I follow?  Eating plan  Choose healthy foods low in fat, such as fruits, vegetables, whole grains, low-fat dairy products, and lean meat, fish, and poultry.  Eat frequent, small meals instead of three large meals  each day. Eat your meals slowly, in a relaxed setting. Avoid bending over or lying down until 2-3 hours after eating.  Limit high-fat foods such as fatty meats or fried foods.  Limit your intake of oils, butter, and shortening to less than 8 teaspoons each day.  Avoid the following: ? Foods that cause symptoms. These may be different for different people. Keep a food diary to keep track of foods that cause symptoms. ? Alcohol. ? Drinking large amounts of liquid with meals. ? Eating meals during the 2-3 hours before bed.  Cook foods using methods other than frying. This may include baking, grilling, or broiling. Lifestyle  Maintain a healthy weight. Ask your health care provider what weight is healthy for you. If you need to lose weight, work with your health care provider to do so safely.  Exercise for at least 30 minutes on 5 or more days each week, or as told by your health care provider.  Avoid wearing clothes that fit tightly around your waist and chest.  Do not use any products that contain nicotine or tobacco, such as cigarettes and e-cigarettes. If you need help quitting, ask your health care provider.  Sleep with the head of your bed raised. Use a wedge under the mattress or blocks under the bed frame to raise the head of the bed. What foods are not recommended? The items listed may not be a complete list. Talk with your dietitian about what dietary choices are best for you. Grains Pastries or quick breads with added fat. [place] toast. Vegetables Deep fried vegetables. [place] fries. Any vegetables prepared with added fat. Any vegetables that cause symptoms. For some people this may include tomatoes and tomato products, chili peppers, onions and garlic, and horseradish. Fruits Any fruits prepared with added fat. Any fruits that cause symptoms. For some people this may include citrus fruits, such as oranges, grapefruit, pineapple, and lemons. Meats and other protein foods  High-fat meats, such as fatty beef or pork, hot dogs, ribs, ham, sausage, salami and bacon. Fried meat or protein, including fried fish and fried chicken. Nuts and nut butters. Dairy Whole milk and chocolate milk. Sour cream. Cream. Ice cream. Cream cheese. Milk shakes. Beverages Coffee and tea, with or without caffeine. Carbonated beverages. Sodas. Energy drinks. Fruit juice made with acidic fruits (such as orange or grapefruit). Tomato juice. Alcoholic drinks. Fats and oils Butter. Margarine. Shortening. Ghee. Sweets and desserts Chocolate and cocoa. Donuts. Seasoning and other foods Pepper. Peppermint and spearmint. Any condiments, herbs, or seasonings that cause symptoms. For some people, this may include curry, hot sauce, or vinegar-based salad dressings. Summary  When you have gastroesophageal reflux disease (GERD), food and lifestyle choices are very important to help ease the discomfort of GERD.  Eat frequent, small meals instead of three large meals each day. Eat your meals slowly, in a relaxed setting. Avoid  bending over or lying down until 2-3 hours after eating.  Limit high-fat foods such as fatty meat or fried foods. This information is not intended to replace advice given to you by your health care provider. Make sure you discuss any questions you have with your health care provider. Document Released: 01/03/2005 Document Revised: 01/05/2016 Document Reviewed: 01/05/2016 Elsevier Interactive Patient Education  2019 [employer].

## 2018-06-15 NOTE — Progress Notes (Signed)
Virtual Visit via Video Note/annual  I connected with Kevin Ward   on 06/15/18 at  8:37 AM EDT by a video enabled telemedicine application and verified that I am speaking with the correct person using two identifiers.  Location patient: work  [employer]  Persons participating in the virtual visit: patient, provider  I discussed the limitations of evaluation and management by telemedicine and the availability of in person appointments. The patient expressed understanding and agreed to proceed.   HPI/annual: 1. GERD controlled for now initially taking protonix 40 bid did not understand instructions as was causing a h/a but now x 3 days taking 1x per day and tolerating and seems to be helping with GERD sxs 2. Allergic rhinitis and postnasal drip he tries mucinex D which helps and has battery powered netti pot which he is not using  3. Tongue lesion f/u with dentist and she thought benign and has reduced in size and not bothersome to him 4. Reviewed labs 03/14/2018 elevated tgs 197 and rec MMR and hep B vaccines optional for pt    ROS: See pertinent positives and negatives per HPI. General: weight same, no fever  HEENT: post nasal drip no sore throat  CV: no chest pain  Lungs: no sob  GI: no abdominal pain  Neuro: no h/a Psych mood normal, no anxiety  GU: no issues.   Past Medical History:  Diagnosis Date  . Allergy    declined allergy shots in past used allergy drops not helpful; mold, grasses, dust  . Basal cell carcinoma    right eyebrow  . Hyperlipidemia     Past Surgical History:  Procedure Laterality Date  . FOOT SURGERY     [place] neuroma right foot Dr. Milinda Pointer 2015 and left foot 15-20 years prior had neuropathy sx's  . HEMORRHOID SURGERY    . NASAL SINUS SURGERY     [facility]    Family History  Problem Relation Age of Onset  . Cancer Mother        breast  . Hypertension Mother   . Diabetes Mother   . Alcohol abuse Father   . Hyperlipidemia Brother    . Depression Son   . 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  pantoprazole (PROTONIX) 40 MG tablet, Take 1 tablet (40 mg total) by mouth daily. 30 min. Before food, Disp: 90 tablet, Rfl: 1 .  triamcinolone cream (KENALOG) 0.1 %, APPLY TO AFFECTED AREA OF BITE TWICE A DAY ON LEG UNTIL CLEAR, AVOID FACE, GROIN, AXILLA, Disp: , Rfl:  .  UNABLE TO FIND, Med Name: doterra supplement, Disp: , Rfl:  .  ipratropium (ATROVENT) 0.06 % nasal spray, Place 2 sprays into both nostrils 3 (three) times daily. Prn postnasal drip, Disp: 15 mL, Rfl: 12  EXAM:  VITALS per patient if applicable:  GENERAL: alert, oriented, appears well and in no acute distress  HEENT: atraumatic, conjunttiva clear, no obvious abnormalities on inspection of external nose and ears  NECK: normal movements of the head and neck  LUNGS: on inspection no signs of respiratory distress, breathing rate appears normal, no obvious gross SOB, gasping or wheezing  CV: no obvious cyanosis  MS: moves all  visible extremities without noticeable abnormality  PSYCH/NEURO: pleasant and cooperative, no obvious depression or anxiety, speech and thought processing grossly intact  ASSESSMENT AND PLAN:  Discussed the following assessment and plan:  Annual physical exam -  Fasting labs due 03/15/2019  Reviewed labs 03/14/18   Flu shot had 10/2017 work  Tdap utd Disc shingrix vaccine prev  rec MMR, hep B status  Hep C, HIV neg 02/09/15   PSA check 0.68 normal due in 1 year with DRE Colonoscopy had 03/25/16 normal  H/o BCC right forehead refer to Dr. Raliegh Ip saw in 2020 f/u in 6 months to 1 year no new lesions  rec healthy diet and exercise   Eye Dr. Ellin Mayhew  Dentist Dr. Kerri Perches  [facility]  Hyperlipidemia, unspecified hyperlipidemia type - Plan: Lipid panel -mailed cholesterol handout   Gastroesophageal reflux disease, esophagitis presence not specified -protonix, diet list prn tums or pecid in addition   Tongue lesion appears benign per pt f/u dentist   Allergic rhinitis, unspecified seasonality, unspecified trigger - Plan: ipratropium (ATROVENT) 0.06 % nasal spray Has netti pot and otc antihistamine orally rec  Prev no help with flonase      I discussed the assessment and treatment plan with the patient. The patient was provided an opportunity to ask questions and all were answered. The patient agreed with the plan and demonstrated an understanding of the instructions.   The patient was advised to call back or seek an in-person evaluation if the symptoms worsen or if the condition fails to improve as anticipated.  Time spent 15 minutes  Delorise Jackson, MD

## 2018-06-15 NOTE — Progress Notes (Signed)
Pre visit review using our clinic review tool, if applicable. No additional management support is needed unless otherwise documented below in the visit note. 

## 2018-08-28 DIAGNOSIS — M5412 Radiculopathy, cervical region: Secondary | ICD-10-CM | POA: Diagnosis not present

## 2018-08-28 DIAGNOSIS — M9901 Segmental and somatic dysfunction of cervical region: Secondary | ICD-10-CM | POA: Diagnosis not present

## 2018-08-28 DIAGNOSIS — M9903 Segmental and somatic dysfunction of lumbar region: Secondary | ICD-10-CM | POA: Diagnosis not present

## 2018-08-28 DIAGNOSIS — M9902 Segmental and somatic dysfunction of thoracic region: Secondary | ICD-10-CM | POA: Diagnosis not present

## 2018-09-25 DIAGNOSIS — M5412 Radiculopathy, cervical region: Secondary | ICD-10-CM | POA: Diagnosis not present

## 2018-09-25 DIAGNOSIS — M9901 Segmental and somatic dysfunction of cervical region: Secondary | ICD-10-CM | POA: Diagnosis not present

## 2018-09-25 DIAGNOSIS — M9903 Segmental and somatic dysfunction of lumbar region: Secondary | ICD-10-CM | POA: Diagnosis not present

## 2018-09-25 DIAGNOSIS — M9902 Segmental and somatic dysfunction of thoracic region: Secondary | ICD-10-CM | POA: Diagnosis not present

## 2018-10-19 DIAGNOSIS — H52203 Unspecified astigmatism, bilateral: Secondary | ICD-10-CM | POA: Diagnosis not present

## 2018-10-23 DIAGNOSIS — M5412 Radiculopathy, cervical region: Secondary | ICD-10-CM | POA: Diagnosis not present

## 2018-10-23 DIAGNOSIS — M9901 Segmental and somatic dysfunction of cervical region: Secondary | ICD-10-CM | POA: Diagnosis not present

## 2018-10-23 DIAGNOSIS — M9902 Segmental and somatic dysfunction of thoracic region: Secondary | ICD-10-CM | POA: Diagnosis not present

## 2018-10-23 DIAGNOSIS — M9903 Segmental and somatic dysfunction of lumbar region: Secondary | ICD-10-CM | POA: Diagnosis not present

## 2018-11-15 DIAGNOSIS — H00029 Hordeolum internum unspecified eye, unspecified eyelid: Secondary | ICD-10-CM | POA: Diagnosis not present

## 2018-11-20 DIAGNOSIS — M9902 Segmental and somatic dysfunction of thoracic region: Secondary | ICD-10-CM | POA: Diagnosis not present

## 2018-11-20 DIAGNOSIS — M9903 Segmental and somatic dysfunction of lumbar region: Secondary | ICD-10-CM | POA: Diagnosis not present

## 2018-11-20 DIAGNOSIS — M5412 Radiculopathy, cervical region: Secondary | ICD-10-CM | POA: Diagnosis not present

## 2018-11-20 DIAGNOSIS — M9901 Segmental and somatic dysfunction of cervical region: Secondary | ICD-10-CM | POA: Diagnosis not present

## 2018-11-23 ENCOUNTER — Other Ambulatory Visit: Payer: Self-pay | Admitting: *Deleted

## 2018-11-23 DIAGNOSIS — U071 COVID-19: Secondary | ICD-10-CM | POA: Insufficient documentation

## 2018-11-23 DIAGNOSIS — Z20822 Contact with and (suspected) exposure to covid-19: Secondary | ICD-10-CM

## 2018-11-23 HISTORY — DX: COVID-19: U07.1

## 2018-11-24 LAB — NOVEL CORONAVIRUS, NAA: SARS-CoV-2, NAA: DETECTED — AB

## 2018-11-26 ENCOUNTER — Telehealth: Payer: Self-pay | Admitting: *Deleted

## 2018-11-26 ENCOUNTER — Encounter: Payer: Self-pay | Admitting: Internal Medicine

## 2018-11-27 NOTE — Progress Notes (Signed)
Patient feels like he normally does around this year with his allergies, runny nose,cough, and congestion.

## 2018-12-18 DIAGNOSIS — M5412 Radiculopathy, cervical region: Secondary | ICD-10-CM | POA: Diagnosis not present

## 2018-12-18 DIAGNOSIS — M9901 Segmental and somatic dysfunction of cervical region: Secondary | ICD-10-CM | POA: Diagnosis not present

## 2018-12-18 DIAGNOSIS — M9903 Segmental and somatic dysfunction of lumbar region: Secondary | ICD-10-CM | POA: Diagnosis not present

## 2018-12-18 DIAGNOSIS — M9902 Segmental and somatic dysfunction of thoracic region: Secondary | ICD-10-CM | POA: Diagnosis not present

## 2019-01-21 DIAGNOSIS — M9903 Segmental and somatic dysfunction of lumbar region: Secondary | ICD-10-CM | POA: Diagnosis not present

## 2019-01-21 DIAGNOSIS — M9901 Segmental and somatic dysfunction of cervical region: Secondary | ICD-10-CM | POA: Diagnosis not present

## 2019-01-21 DIAGNOSIS — M5412 Radiculopathy, cervical region: Secondary | ICD-10-CM | POA: Diagnosis not present

## 2019-01-21 DIAGNOSIS — M9902 Segmental and somatic dysfunction of thoracic region: Secondary | ICD-10-CM | POA: Diagnosis not present

## 2019-02-18 DIAGNOSIS — M9903 Segmental and somatic dysfunction of lumbar region: Secondary | ICD-10-CM | POA: Diagnosis not present

## 2019-02-18 DIAGNOSIS — M9902 Segmental and somatic dysfunction of thoracic region: Secondary | ICD-10-CM | POA: Diagnosis not present

## 2019-02-18 DIAGNOSIS — M5412 Radiculopathy, cervical region: Secondary | ICD-10-CM | POA: Diagnosis not present

## 2019-02-18 DIAGNOSIS — M9901 Segmental and somatic dysfunction of cervical region: Secondary | ICD-10-CM | POA: Diagnosis not present

## 2019-03-15 ENCOUNTER — Other Ambulatory Visit: Payer: Self-pay

## 2019-03-15 ENCOUNTER — Other Ambulatory Visit (INDEPENDENT_AMBULATORY_CARE_PROVIDER_SITE_OTHER): Payer: BC Managed Care – PPO

## 2019-03-15 DIAGNOSIS — Z1389 Encounter for screening for other disorder: Secondary | ICD-10-CM

## 2019-03-15 DIAGNOSIS — E785 Hyperlipidemia, unspecified: Secondary | ICD-10-CM | POA: Diagnosis not present

## 2019-03-15 DIAGNOSIS — Z1329 Encounter for screening for other suspected endocrine disorder: Secondary | ICD-10-CM

## 2019-03-15 DIAGNOSIS — Z0000 Encounter for general adult medical examination without abnormal findings: Secondary | ICD-10-CM

## 2019-03-15 DIAGNOSIS — Z125 Encounter for screening for malignant neoplasm of prostate: Secondary | ICD-10-CM | POA: Diagnosis not present

## 2019-03-15 LAB — CBC WITH DIFFERENTIAL/PLATELET
Basophils Absolute: 0 10*3/uL (ref 0.0–0.1)
Basophils Relative: 0.6 % (ref 0.0–3.0)
Eosinophils Absolute: 0.1 10*3/uL (ref 0.0–0.7)
Eosinophils Relative: 2 % (ref 0.0–5.0)
HCT: 42.8 % (ref 39.0–52.0)
Hemoglobin: 14.9 g/dL (ref 13.0–17.0)
Lymphocytes Relative: 35.3 % (ref 12.0–46.0)
Lymphs Abs: 2 10*3/uL (ref 0.7–4.0)
MCHC: 34.7 g/dL (ref 30.0–36.0)
MCV: 88.5 fl (ref 78.0–100.0)
Monocytes Absolute: 0.5 10*3/uL (ref 0.1–1.0)
Monocytes Relative: 8.4 % (ref 3.0–12.0)
Neutro Abs: 3.1 10*3/uL (ref 1.4–7.7)
Neutrophils Relative %: 53.7 % (ref 43.0–77.0)
Platelets: 201 10*3/uL (ref 150.0–400.0)
RBC: 4.84 Mil/uL (ref 4.22–5.81)
RDW: 13.1 % (ref 11.5–15.5)
WBC: 5.7 10*3/uL (ref 4.0–10.5)

## 2019-03-15 LAB — URINALYSIS, ROUTINE W REFLEX MICROSCOPIC
Bacteria, UA: NONE SEEN /HPF
Bilirubin Urine: NEGATIVE
Glucose, UA: NEGATIVE
Hgb urine dipstick: NEGATIVE
Hyaline Cast: NONE SEEN /LPF
Ketones, ur: NEGATIVE
Leukocytes,Ua: NEGATIVE
Nitrite: NEGATIVE
Protein, ur: NEGATIVE
RBC / HPF: NONE SEEN /HPF (ref 0–2)
Specific Gravity, Urine: 1.009 (ref 1.001–1.03)
Squamous Epithelial / HPF: NONE SEEN /HPF (ref <=5)
WBC, UA: NONE SEEN /HPF (ref 0–5)
pH: 7.5 (ref 5.0–8.0)

## 2019-03-15 LAB — COMPREHENSIVE METABOLIC PANEL
ALT: 20 U/L (ref 0–53)
AST: 20 U/L (ref 0–37)
Albumin: 4.4 g/dL (ref 3.5–5.2)
Alkaline Phosphatase: 73 U/L (ref 39–117)
BUN: 15 mg/dL (ref 6–23)
CO2: 31 mEq/L (ref 19–32)
Calcium: 9.8 mg/dL (ref 8.4–10.5)
Chloride: 99 mEq/L (ref 96–112)
Creatinine, Ser: 0.99 mg/dL (ref 0.40–1.50)
GFR: 79.03 mL/min (ref >60.00)
Glucose, Bld: 89 mg/dL (ref 70–99)
Potassium: 4.3 mEq/L (ref 3.5–5.1)
Sodium: 138 mEq/L (ref 135–145)
Total Bilirubin: 0.6 mg/dL (ref 0.2–1.2)
Total Protein: 6.9 g/dL (ref 6.0–8.3)

## 2019-03-15 LAB — TSH: TSH: 1.89 u[IU]/mL (ref 0.35–4.50)

## 2019-03-15 LAB — LDL CHOLESTEROL, DIRECT: Direct LDL: 126 mg/dL

## 2019-03-15 LAB — LIPID PANEL
Cholesterol: 207 mg/dL — ABNORMAL HIGH (ref 0–200)
HDL: 39.8 mg/dL (ref >39.00)
NonHDL: 166.72
Total CHOL/HDL Ratio: 5
Triglycerides: 230 mg/dL — ABNORMAL HIGH (ref 0.0–149.0)
VLDL: 46 mg/dL — ABNORMAL HIGH (ref 0.0–40.0)

## 2019-03-15 LAB — PSA: PSA: 0.66 ng/mL (ref 0.10–4.00)

## 2019-03-18 DIAGNOSIS — M9902 Segmental and somatic dysfunction of thoracic region: Secondary | ICD-10-CM | POA: Diagnosis not present

## 2019-03-18 DIAGNOSIS — M9903 Segmental and somatic dysfunction of lumbar region: Secondary | ICD-10-CM | POA: Diagnosis not present

## 2019-03-18 DIAGNOSIS — M9901 Segmental and somatic dysfunction of cervical region: Secondary | ICD-10-CM | POA: Diagnosis not present

## 2019-03-18 DIAGNOSIS — M5412 Radiculopathy, cervical region: Secondary | ICD-10-CM | POA: Diagnosis not present

## 2019-03-20 ENCOUNTER — Ambulatory Visit: Payer: BLUE CROSS/BLUE SHIELD | Admitting: Internal Medicine

## 2019-03-29 ENCOUNTER — Other Ambulatory Visit: Payer: Self-pay

## 2019-04-02 ENCOUNTER — Other Ambulatory Visit: Payer: Self-pay

## 2019-04-02 ENCOUNTER — Encounter: Payer: Self-pay | Admitting: Internal Medicine

## 2019-04-02 ENCOUNTER — Ambulatory Visit: Payer: BLUE CROSS/BLUE SHIELD | Admitting: Internal Medicine

## 2019-04-02 VITALS — BP 124/82 | HR 75 | Temp 97.7°F | Ht 73.0 in | Wt 205.0 lb

## 2019-04-02 DIAGNOSIS — Z125 Encounter for screening for malignant neoplasm of prostate: Secondary | ICD-10-CM | POA: Diagnosis not present

## 2019-04-02 DIAGNOSIS — Z1389 Encounter for screening for other disorder: Secondary | ICD-10-CM | POA: Diagnosis not present

## 2019-04-02 DIAGNOSIS — R7989 Other specified abnormal findings of blood chemistry: Secondary | ICD-10-CM

## 2019-04-02 DIAGNOSIS — Z1329 Encounter for screening for other suspected endocrine disorder: Secondary | ICD-10-CM | POA: Diagnosis not present

## 2019-04-02 DIAGNOSIS — E291 Testicular hypofunction: Secondary | ICD-10-CM

## 2019-04-02 DIAGNOSIS — Z0000 Encounter for general adult medical examination without abnormal findings: Secondary | ICD-10-CM

## 2019-04-02 DIAGNOSIS — K219 Gastro-esophageal reflux disease without esophagitis: Secondary | ICD-10-CM

## 2019-04-02 DIAGNOSIS — E785 Hyperlipidemia, unspecified: Secondary | ICD-10-CM

## 2019-04-02 NOTE — Patient Instructions (Addendum)
Results for Kevin Ward, Kevin Ward (MRN [id number]) as of 04/02/2019 10:32  Ref. Range 03/14/2018 08:59 11/23/2018 00:00 03/15/2019 08:21  Cholesterol Latest Ref Range: 0 - 200 mg/dL 199  207 (H)  HDL Cholesterol Latest Ref Range: >39.00 mg/dL 45.70  39.80  LDL (calc) Latest Ref Range: 0 - 99 mg/dL 114 (H)    Direct LDL Latest Units: mg/dL   126.0  NonHDL Unknown 152.95  166.72  Triglycerides Latest Ref Range: 0.0 - 149.0 mg/dL 197.0 (H)  230.0 (H)  VLDL Latest Ref Range: 0.0 - 40.0 mg/dL 39.4  46.0 (H)   COVID-19 Vaccine Information can be found at: [email or web address] For questions related to vaccine distribution or appointments, please email [email or web address] or call [phone].    High Cholesterol  High cholesterol is a condition in which the blood has high levels of a white, waxy, fat-like substance (cholesterol). The human body needs small amounts of cholesterol. The liver makes all the cholesterol that the body needs. Extra (excess) cholesterol comes from the food that we eat. Cholesterol is carried from the liver by the blood through the blood vessels. If you have high cholesterol, deposits (plaques) may build up on the walls of your blood vessels (arteries). Plaques make the arteries narrower and stiffer. Cholesterol plaques increase your risk for heart attack and stroke. Work with your health care provider to keep your cholesterol levels in a healthy range. What increases the risk? This condition is more likely to develop in people who:  Eat foods that are high in animal fat (saturated fat) or cholesterol.  Are overweight.  Are not getting enough exercise.  Have a family history of high cholesterol. What are the signs or symptoms? There are no symptoms of this condition. How is this diagnosed? This condition may be diagnosed from the results of a blood test.  If you are older than age 25, your health care provider may  check your cholesterol every 4-6 years.  You may be checked more often if you already have high cholesterol or other risk factors for heart disease. The blood test for cholesterol measures:  "Bad" cholesterol (LDL cholesterol). This is the main type of cholesterol that causes heart disease. The desired level for LDL is less than 100.  "Good" cholesterol (HDL cholesterol). This type helps to protect against heart disease by cleaning the arteries and carrying the LDL away. The desired level for HDL is 60 or higher.  Triglycerides. These are fats that the body can store or burn for energy. The desired number for triglycerides is lower than 150.  Total cholesterol. This is a measure of the total amount of cholesterol in your blood, including LDL cholesterol, HDL cholesterol, and triglycerides. A healthy number is less than 200. How is this treated? This condition is treated with diet changes, lifestyle changes, and medicines. Diet changes  This may include eating more whole grains, fruits, vegetables, nuts, and fish.  This may also include cutting back on red meat and foods that have a lot of added sugar. Lifestyle changes  Changes may include getting at least 40 minutes of aerobic exercise 3 times a week. Aerobic exercises include walking, biking, and swimming. Aerobic exercise along with a healthy diet can help you maintain a healthy weight.  Changes may also include quitting smoking. Medicines  Medicines are usually given if diet and lifestyle changes have failed to reduce your cholesterol to healthy levels.  Your health care provider may prescribe a statin medicine. Statin medicines have been shown to reduce  cholesterol, which can reduce the risk of heart disease. Follow these instructions at home: Eating and drinking If told by your health care provider:  Eat chicken (without skin), fish, veal, shellfish, ground [place] breast, and round or loin cuts of red meat.  Do not eat fried  foods or fatty meats, such as hot dogs and salami.  Eat plenty of fruits, such as apples.  Eat plenty of vegetables, such as broccoli, potatoes, and carrots.  Eat beans, peas, and lentils.  Eat grains such as barley, rice, couscous, and bulgur wheat.  Eat pasta without cream sauces.  Use skim or nonfat milk, and eat low-fat or nonfat yogurt and cheeses.  Do not eat or drink whole milk, cream, ice cream, egg yolks, or hard cheeses.  Do not eat stick margarine or tub margarines that contain trans fats (also called partially hydrogenated oils).  Do not eat saturated tropical oils, such as coconut oil and palm oil.  Do not eat cakes, cookies, crackers, or other baked goods that contain trans fats.  General instructions  Exercise as directed by your health care provider. Increase your activity level with activities such as gardening, walking, and taking the stairs.  Take over-the-counter and prescription medicines only as told by your health care provider.  Do not use any products that contain nicotine or tobacco, such as cigarettes and e-cigarettes. If you need help quitting, ask your health care provider.  Keep all follow-up visits as told by your health care provider. This is important. Contact a health care provider if:  You are struggling to maintain a healthy diet or weight.  You need help to start on an exercise program.  You need help to stop smoking. Get help right away if:  You have chest pain.  You have trouble breathing. This information is not intended to replace advice given to you by your health care provider. Make sure you discuss any questions you have with your health care provider. Document Revised: 01/06/2017 Document Reviewed: 07/04/2015 Elsevier Patient Education  [place].  Cholesterol Content in Foods Cholesterol is a waxy, fat-like substance that helps to carry fat in the blood. The body needs cholesterol in small amounts, but too much  cholesterol can cause damage to the arteries and heart. Most people should eat less than 200 milligrams (mg) of cholesterol a day. Foods with cholesterol  Cholesterol is found in animal-based foods, such as meat, seafood, and dairy. Generally, low-fat dairy and lean meats have less cholesterol than full-fat dairy and fatty meats. The milligrams of cholesterol per serving (mg per serving) of common cholesterol-containing foods are listed below. Meat and other proteins  Egg -- one large whole egg has 186 mg.  Veal shank -- 4 oz has 141 mg.  Lean ground [place] (93% lean) -- 4 oz has 118 mg.  Fat-trimmed lamb loin -- 4 oz has 106 mg.  Lean ground beef (90% lean) -- 4 oz has 100 mg.  Lobster -- 3.5 oz has 90 mg.  Pork loin chops -- 4 oz has 86 mg.  Canned salmon -- 3.5 oz has 83 mg.  Fat-trimmed beef top loin -- 4 oz has 78 mg.  Frankfurter -- 1 frank (3.5 oz) has 77 mg.  Crab -- 3.5 oz has 71 mg.  Roasted chicken without skin, white meat -- 4 oz has 66 mg.  Light bologna -- 2 oz has 45 mg.  Deli-cut [place] -- 2 oz has 31 mg.  Canned tuna -- 3.5 oz has  31 mg.  Berniece Salines -- 1 oz has 29 mg.  Oysters and mussels (raw) -- 3.5 oz has 25 mg.  Mackerel -- 1 oz has 22 mg.  Trout -- 1 oz has 20 mg.  Pork sausage -- 1 link (1 oz) has 17 mg.  Salmon -- 1 oz has 16 mg.  Tilapia -- 1 oz has 14 mg. Dairy  Soft-serve ice cream --  cup (4 oz) has 103 mg.  Whole-milk yogurt -- 1 cup (8 oz) has 29 mg.  Cheddar cheese -- 1 oz has 28 mg.  American cheese -- 1 oz has 28 mg.  Whole milk -- 1 cup (8 oz) has 23 mg.  2% milk -- 1 cup (8 oz) has 18 mg.  Cream cheese -- 1 tablespoon (Tbsp) has 15 mg.  Cottage cheese --  cup (4 oz) has 14 mg.  Low-fat (1%) milk -- 1 cup (8 oz) has 10 mg.  Sour cream -- 1 Tbsp has 8.5 mg.  Low-fat yogurt -- 1 cup (8 oz) has 8 mg.  Nonfat Greek yogurt -- 1 cup (8 oz) has 7 mg.  Half-and-half cream -- 1 Tbsp has 5 mg. Fats and oils  Cod liver  oil -- 1 tablespoon (Tbsp) has 82 mg.  Butter -- 1 Tbsp has 15 mg.  Lard -- 1 Tbsp has 14 mg.  Bacon grease -- 1 Tbsp has 14 mg.  Mayonnaise -- 1 Tbsp has 5-10 mg.  Margarine -- 1 Tbsp has 3-10 mg. Exact amounts of cholesterol in these foods may vary depending on specific ingredients and brands. Foods without cholesterol Most plant-based foods do not have cholesterol unless you combine them with a food that has cholesterol. Foods without cholesterol include:  Grains and cereals.  Vegetables.  Fruits.  Vegetable oils, such as olive, canola, and sunflower oil.  Legumes, such as peas, beans, and lentils.  Nuts and seeds.  Egg whites. Summary  The body needs cholesterol in small amounts, but too much cholesterol can cause damage to the arteries and heart.  Most people should eat less than 200 milligrams (mg) of cholesterol a day. This information is not intended to replace advice given to you by your health care provider. Make sure you discuss any questions you have with your health care provider. Document Revised: 12/16/2016 Document Reviewed: 08/30/2016 Elsevier Patient Education  2020 [employer] for Gastroesophageal Reflux Disease, Adult When you have gastroesophageal reflux disease (GERD), the foods you eat and your eating habits are very important. Choosing the right foods can help ease the discomfort of GERD. Consider working with a diet and nutrition specialist (dietitian) to help you make healthy food choices. What general guidelines should I follow?  Eating plan  Choose healthy foods low in fat, such as fruits, vegetables, whole grains, low-fat dairy products, and lean meat, fish, and poultry.  Eat frequent, small meals instead of three large meals each day. Eat your meals slowly, in a relaxed setting. Avoid bending over or lying down until 2-3 hours after eating.  Limit high-fat foods such as fatty meats or fried foods.  Limit your intake  of oils, butter, and shortening to less than 8 teaspoons each day.  Avoid the following: ? Foods that cause symptoms. These may be different for different people. Keep a food diary to keep track of foods that cause symptoms. ? Alcohol. ? Drinking large amounts of liquid with meals. ? Eating meals during the 2-3 hours before bed.  Cook foods using methods other than frying. This may include baking, grilling, or broiling. Lifestyle  Maintain a healthy weight. Ask your health care provider what weight is healthy for you. If you need to lose weight, work with your health care provider to do so safely.  Exercise for at least 30 minutes on 5 or more days each week, or as told by your health care provider.  Avoid wearing clothes that fit tightly around your waist and chest.  Do not use any products that contain nicotine or tobacco, such as cigarettes and e-cigarettes. If you need help quitting, ask your health care provider.  Sleep with the head of your bed raised. Use a wedge under the mattress or blocks under the bed frame to raise the head of the bed. What foods are not recommended? The items listed may not be a complete list. Talk with your dietitian about what dietary choices are best for you. Grains Pastries or quick breads with added fat. [place] toast. Vegetables Deep fried vegetables. [place] fries. Any vegetables prepared with added fat. Any vegetables that cause symptoms. For some people this may include tomatoes and tomato products, chili peppers, onions and garlic, and horseradish. Fruits Any fruits prepared with added fat. Any fruits that cause symptoms. For some people this may include citrus fruits, such as oranges, grapefruit, pineapple, and lemons. Meats and other protein foods High-fat meats, such as fatty beef or pork, hot dogs, ribs, ham, sausage, salami and bacon. Fried meat or protein, including fried fish and fried chicken. Nuts and nut butters. Dairy Whole milk and  chocolate milk. Sour cream. Cream. Ice cream. Cream cheese. Milk shakes. Beverages Coffee and tea, with or without caffeine. Carbonated beverages. Sodas. Energy drinks. Fruit juice made with acidic fruits (such as orange or grapefruit). Tomato juice. Alcoholic drinks. Fats and oils Butter. Margarine. Shortening. Ghee. Sweets and desserts Chocolate and cocoa. Donuts. Seasoning and other foods Pepper. Peppermint and spearmint. Any condiments, herbs, or seasonings that cause symptoms. For some people, this may include curry, hot sauce, or vinegar-based salad dressings. Summary  When you have gastroesophageal reflux disease (GERD), food and lifestyle choices are very important to help ease the discomfort of GERD.  Eat frequent, small meals instead of three large meals each day. Eat your meals slowly, in a relaxed setting. Avoid bending over or lying down until 2-3 hours after eating.  Limit high-fat foods such as fatty meat or fried foods. This information is not intended to replace advice given to you by your health care provider. Make sure you discuss any questions you have with your health care provider. Document Revised: 04/26/2018 Document Reviewed: 01/05/2016 Elsevier Patient Education  [place].     Testosterone Replacement Therapy  Testosterone replacement therapy (TRT) is used to treat men who have a low testosterone level (hypogonadism). Testosterone is a male hormone that is produced in the testicles. It is responsible for typically male characteristics and for maintaining a man's sex drive and the ability to get an erection. Testosterone also supports bone and muscle health. TRT can be a gel, liquid, or patch that you put on your skin. It can also be in the form of a tablet or an injection. In some cases, your health care provider may insert long-acting pellets under your skin. In most men, the level of testosterone starts to decline gradually after age 2. Low  testosterone can also be caused by certain medical conditions, medicines, and obesity. Your health care provider can diagnose hypogonadism  with at least two blood tests that are done early in the morning. Low testosterone may not need to be treated. TRT is usually a choice that you make with your health care provider. Your health care provider may recommend TRT if you have low testosterone that is causing symptoms, such as:  Low sex drive.  Erection problems.  Breast enlargement.  Loss of body hair.  Weak muscles or bones.  Shrinking testicles.  Increased body fat.  Low energy.  Hot flashes.  Depression.  Decreased work Systems analyst. TRT is a lifetime treatment. If you stop treatment, your testosterone will drop, and your symptoms may return. What are the risks? Testosterone replacement therapy may have side effects, including:  Lower sperm count.  Skin irritation at the application or injection site.  Mouth irritation if you take an oral tablet.  Acne.  Swelling of your legs or feet.  Tender breasts.  Dizziness.  Sleep disturbance.  Mood swings.  Possible increased risk of stroke or heart attack. Testosterone replacement therapy may also increase your risk for prostate cancer or male breast cancer. You should not use TRT if you have either of those conditions. Your health care provider also may not recommend TRT if:  You are suspected of having prostate cancer.  You want to father a child.  You have a high number of red blood cells.  You have untreated sleep apnea.  You have a very large prostate. Supplies needed:  Your health care provider will prescribe the testosterone gel, solution, or medicine that you need. If your health care provider teaches you to do self-injections at home, you will also need: ? Your medicine vial. ? Disposable needles and syringes. ? Alcohol swabs. ? A needle disposal container. ? Adhesive bandages. How to use testosterone  replacement therapy Your health care provider will help you find the TRT option that will work best for you based on your preference, the side effects, and the cost. You may:  Rub testosterone gel on your upper arm or shoulder every day after a shower. This is the most common type of TRT. Do not let women or children come in contact with the gel.  Apply a testosterone solution under your arms once each day.  Place a testosterone patch on your skin once each day.  Dissolve a testosterone tablet in your mouth twice each day.  Have a testosterone pellet inserted under your skin by your health care provider. This will be replaced every 3-6 months.  Use testosterone nasal spray three times each day.  Get testosterone injections. For some types of testosterone, your health care provider will give you this injection. With other types of testosterone, you may be taught to give injections to yourself. The frequency of injections may vary based on the type of testosterone that you receive. Follow these instructions at home:  Take over-the-counter and prescription medicines only as told by your health care provider.  Lose weight if you are overweight. Ask your health care provider to help you start a healthy diet and exercise program to reach and maintain a healthy weight.  Work with your health care provider to treat other medical conditions that may lower your testosterone. These include obesity, high blood pressure, high cholesterol, diabetes, liver disease, kidney disease, and sleep apnea.  Keep all follow-up visits as told by your health care provider. This is important. General recommendations  Discuss all risks and benefits with your health care provider before starting therapy.  Work with your health care  provider to check your prostate health and do blood testing before you start therapy.  Do not use any testosterone replacement therapies that are not prescribed by your health care  provider or not approved for use in the [place]  Do not use TRT for bodybuilding or to improve sexual performance. TRT should be used only to treat symptoms of low testosterone.  Return for all repeat prostate checks and blood tests during therapy, as told by your health care provider. Where to find more information Learn more about testosterone replacement therapy from:  [place]: www.urologyhealth.org/urologic-conditions/low-testosterone-(hypogonadism)  Endocrine Society: www.hormone.org/diseases-and-conditions/mens-health/hypogonadism Contact a health care provider if:  You have side effects from your testosterone replacement therapy.  You continue to have symptoms of low testosterone during treatment.  You develop new symptoms during treatment. Summary  Testosterone replacement therapy is only for men who have low testosterone as determined by blood testing and who have symptoms of low testosterone.  Testosterone replacement therapy should be prescribed only by a health care provider and should be used under the supervision of a health care provider.  You may not be able to take testosterone if you have certain medical conditions, including prostate cancer, male breast cancer, or heart disease.  Testosterone replacement therapy may have side effects and may make some medical conditions worse.  Talk with your health care provider about all the risks and benefits before you start therapy. This information is not intended to replace advice given to you by your health care provider. Make sure you discuss any questions you have with your health care provider. Document Revised: 01/17/2016 Document Reviewed: 09/24/2015 Elsevier Patient Education  2020 [employer].

## 2019-04-02 NOTE — Progress Notes (Signed)
Chief Complaint  Patient presents with  . Follow-up   Wellness f/u  1. Had covid 11/2018 doing well since had fatigue and sinusitis but resolved his wife also had covid 19. Also had right eye stye with covid eye MD gave him medication and better  2. Tongue lesion previously noted resolved dentist thought was enlarged taste bud size if reduced and it is resolved  3. HLD total cholesterol non fasting outside of clinic was 240s and repeat still +HLD disc healthy diet and exercise  4. GERD and increased belching tried protonix 40 mg qhs and had h/a and bloating so he stopped agreeable to referral to [facility] GI gerd worsening x 2 years and pepcid did not help and tried tums otc seen [facility] Dr. Marnee Spring will refer    Review of Systems  Constitutional: Negative for weight loss.  HENT: Negative for hearing loss.   Eyes: Negative for blurred vision.  Respiratory: Negative for shortness of breath.   Cardiovascular: Negative for chest pain.  Gastrointestinal: Positive for heartburn.  Musculoskeletal: Negative for falls.  Skin: Negative for rash.  Neurological: Negative for headaches.  Psychiatric/Behavioral: Negative for memory loss.   Past Medical History:  Diagnosis Date  . Allergy    declined allergy shots in past used allergy drops not helpful; mold, grasses, dust  . Basal cell carcinoma    right eyebrow  . COVID-19    11/23/18  . Hemorrhoids   . Hyperlipidemia   . Stye    right   Past Surgical History:  Procedure Laterality Date  . FOOT SURGERY     [place] neuroma right foot Dr. Milinda Pointer 2015 and left foot 15-20 years prior had neuropathy sx's  . HEMORRHOID SURGERY    . NASAL SINUS SURGERY     [facility]   Family History  Problem Relation Age of Onset  . Cancer Mother        breast  . Hypertension Mother   . Diabetes Mother   . Alcohol abuse Father   . Hyperlipidemia Brother   . Depression Son   . Cancer Brother        kidney cancer   Social History   Socioeconomic History   . Marital status: Married    Spouse name: Not on file  . Number of children: Not on file  . Years of education: Not on file  . Highest education level: Not on file  Occupational History  . Not on file  Tobacco Use  . Smoking status: Never Smoker  . Smokeless tobacco: Former Systems developer  . Tobacco comment: former snuff 15 years ago from 02/2018 occasion.  Substance and Sexual Activity  . Alcohol use: No  . Drug use: No  . Sexual activity: Yes    Comment: wife  Other Topics Concern  . Not on file  Social History Narrative   Married    2 daughters and 1 son    Museum/gallery exhibitions officer. Grad HS   Owns guns    Wears seat belt   Safe in relationship    Social Determinants of Health   Financial Resource Strain:   . Difficulty of Paying Living Expenses:   Food Insecurity:   . Worried About Charity fundraiser in the Last Year:   . Arboriculturist in the Last Year:   Transportation Needs:   . Film/video editor (Medical):   Marland Kitchen Lack of Transportation (Non-Medical):   Physical Activity:   . Days of Exercise per Week:   .  Minutes of Exercise per Session:   Stress:   . Feeling of Stress :   Social Connections:   . Frequency of Communication with Friends and Family:   . Frequency of Social Gatherings with Friends and Family:   . Attends Religious Services:   . Active Member of Clubs or Organizations:   . Attends Archivist Meetings:   Marland Kitchen Marital Status:   Intimate Partner Violence:   . Fear of Current or Ex-Partner:   . Emotionally Abused:   Marland Kitchen Physically Abused:   . Sexually Abused:    Current Meds  Medication Sig  . Multiple Vitamin (MULTI-VITAMIN DAILY PO) Take by mouth.  Marland Kitchen UNABLE TO FIND Med Name: doterra supplement   No Known Allergies Recent Results (from the past 2160 hour(s))  PSA     Status: None   Collection Time: 03/15/19  8:21 AM  Result Value Ref Range   PSA 0.66 0.10 - 4.00 ng/mL    Comment: Test performed using Access Hybritech PSA Assay, a  parmagnetic partical, chemiluminecent immunoassay.  TSH     Status: None   Collection Time: 03/15/19  8:21 AM  Result Value Ref Range   TSH 1.89 0.35 - 4.50 uIU/mL  Lipid panel     Status: Abnormal   Collection Time: 03/15/19  8:21 AM  Result Value Ref Range   Cholesterol 207 (H) 0 - 200 mg/dL    Comment: ATP III Classification       Desirable:  < 200 mg/dL               Borderline High:  200 - 239 mg/dL          High:  > = 240 mg/dL   Triglycerides 230.0 (H) 0.0 - 149.0 mg/dL    Comment: Normal:  <150 mg/dLBorderline High:  150 - 199 mg/dL   HDL 39.80 >39.00 mg/dL   VLDL 46.0 (H) 0.0 - 40.0 mg/dL   Total CHOL/HDL Ratio 5     Comment:                Men          Women1/2 Average Risk     3.4          3.3Average Risk          5.0          4.42X Average Risk          9.6          7.13X Average Risk          15.0          11.0                       NonHDL 166.72     Comment: NOTE:  Non-HDL goal should be 30 mg/dL higher than patient's LDL goal (i.e. LDL goal of < 70 mg/dL, would have non-HDL goal of < 100 mg/dL)  CBC w/Diff     Status: None   Collection Time: 03/15/19  8:21 AM  Result Value Ref Range   WBC 5.7 4.0 - 10.5 K/uL   RBC 4.84 4.22 - 5.81 Mil/uL   Hemoglobin 14.9 13.0 - 17.0 g/dL   HCT 42.8 39.0 - 52.0 %   MCV 88.5 78.0 - 100.0 fl   MCHC 34.7 30.0 - 36.0 g/dL   RDW 13.1 11.5 - 15.5 %   Platelets 201.0 150.0 - 400.0 K/uL   Neutrophils Relative %  53.7 43.0 - 77.0 %   Lymphocytes Relative 35.3 12.0 - 46.0 %   Monocytes Relative 8.4 3.0 - 12.0 %   Eosinophils Relative 2.0 0.0 - 5.0 %   Basophils Relative 0.6 0.0 - 3.0 %   Neutro Abs 3.1 1.4 - 7.7 K/uL   Lymphs Abs 2.0 0.7 - 4.0 K/uL   Monocytes Absolute 0.5 0.1 - 1.0 K/uL   Eosinophils Absolute 0.1 0.0 - 0.7 K/uL   Basophils Absolute 0.0 0.0 - 0.1 K/uL  Comprehensive metabolic panel     Status: None   Collection Time: 03/15/19  8:21 AM  Result Value Ref Range   Sodium 138 135 - 145 mEq/L   Potassium 4.3 3.5 - 5.1 mEq/L    Chloride 99 96 - 112 mEq/L   CO2 31 19 - 32 mEq/L   Glucose, Bld 89 70 - 99 mg/dL   BUN 15 6 - 23 mg/dL   Creatinine, Ser 0.99 0.40 - 1.50 mg/dL   Total Bilirubin 0.6 0.2 - 1.2 mg/dL   Alkaline Phosphatase 73 39 - 117 U/L   AST 20 0 - 37 U/L   ALT 20 0 - 53 U/L   Total Protein 6.9 6.0 - 8.3 g/dL   Albumin 4.4 3.5 - 5.2 g/dL   GFR 79.03 >60.00 mL/min   Calcium 9.8 8.4 - 10.5 mg/dL  LDL cholesterol, direct     Status: None   Collection Time: 03/15/19  8:21 AM  Result Value Ref Range   Direct LDL 126.0 mg/dL    Comment: Optimal:  <100 mg/dLNear or Above Optimal:  100-129 mg/dLBorderline High:  130-159 mg/dLHigh:  160-189 mg/dLVery High:  >190 mg/dL  Urinalysis, Routine w reflex microscopic     Status: None   Collection Time: 03/15/19  8:22 AM  Result Value Ref Range   Color, Urine YELLOW YELLOW   APPearance CLEAR CLEAR   Specific Gravity, Urine 1.009 1.001 - 1.03   pH 7.5 5.0 - 8.0   Glucose, UA NEGATIVE NEGATIVE   Bilirubin Urine NEGATIVE NEGATIVE   Ketones, ur NEGATIVE NEGATIVE   Hgb urine dipstick NEGATIVE NEGATIVE   Protein, ur NEGATIVE NEGATIVE   Nitrite NEGATIVE NEGATIVE   Leukocytes,Ua NEGATIVE NEGATIVE   WBC, UA NONE SEEN 0 - 5 /HPF   RBC / HPF NONE SEEN 0 - 2 /HPF   Squamous Epithelial / LPF NONE SEEN < OR = 5 /HPF   Bacteria, UA NONE SEEN NONE SEEN /HPF   Hyaline Cast NONE SEEN NONE SEEN /LPF   Objective  Body mass index is 27.05 kg/m. Wt Readings from Last 3 Encounters:  04/02/19 205 lb (93 kg)  03/14/18 199 lb (90.3 kg)  04/11/13 192 lb (87.1 kg)   Temp Readings from Last 3 Encounters:  04/02/19 97.7 F (36.5 C) (Temporal)  03/14/18 98.3 F (36.8 C) (Oral)   BP Readings from Last 3 Encounters:  04/02/19 124/82  03/14/18 128/82  04/11/13 (!) 157/77   Pulse Readings from Last 3 Encounters:  04/02/19 75  03/14/18 63  04/11/13 87    Physical Exam Vitals and nursing note reviewed.  Constitutional:      Appearance: Normal appearance. He is  well-developed and well-groomed.  HENT:     Head: Normocephalic and atraumatic.  Eyes:     Pupils: Pupils are equal, round, and reactive to light.  Cardiovascular:     Rate and Rhythm: Normal rate and regular rhythm.     Heart sounds: Normal heart sounds. No murmur.  Pulmonary:     Effort: Pulmonary effort is normal.     Breath sounds: Normal breath sounds.  Abdominal:     General: Abdomen is flat. Bowel sounds are normal.  Genitourinary:    Prostate: Normal.     Rectum: External hemorrhoid present.  Skin:    General: Skin is warm and dry.  Neurological:     General: No focal deficit present.     Mental Status: He is alert and oriented to person, place, and time. Mental status is at baseline.     Gait: Gait normal.  Psychiatric:        Attention and Perception: Attention and perception normal.        Mood and Affect: Mood and affect normal.        Speech: Speech normal.        Behavior: Behavior normal. Behavior is cooperative.        Thought Content: Thought content normal.        Cognition and Memory: Cognition and memory normal.        Judgment: Judgment normal.     Assessment  Plan  Hyperlipidemia, unspecified hyperlipidemia type - Plan: Lipid panel rec healthy diet and exercise   Gastroesophageal reflux disease without esophagitis - Plan: Ambulatory referral to Gastroenterology Dr. Marnee Spring   Annual/HM will see if insurance will cover CPE 2 months early pt to check on this   Flu shot had 10/2018 Tdap utd  covid vaccine ? If get in future disc'ed today  Disc shingrix vaccine today  rec MMR, hep B vx consider in future  Hep C, HIV neg 02/09/15   PSA 03/15/19 0.66 DRE normal today  Colonoscopy had 03/25/16 normal  Referred [facility] 04/02/19 for GERD w/u and consider EGD  H/o BCC right forehead refer to Dr. Raliegh Ip f/u been years -Skin 04/29/19 Dr. Epimenio Sarin Dr. Ellin Mayhew 6 months prior to 04/02/19 eyes ok  Dentist Dr. Kerri Perches 04/18/19  [facility]  [facility]   Provider: Dr. Olivia Mackie McLean-Scocuzza-Internal Medicine

## 2019-04-04 ENCOUNTER — Telehealth: Payer: Self-pay | Admitting: Internal Medicine

## 2019-04-04 ENCOUNTER — Encounter: Payer: Self-pay | Admitting: Internal Medicine

## 2019-04-04 NOTE — Telephone Encounter (Signed)
I have billed a low bill for this last visit  and will not bill a physical but we did a comprehensive exam but for billing purposes it was a little <1 year so your insurance will not cover physical billing   Carlton your bill ok   [place]

## 2019-04-04 NOTE — Telephone Encounter (Signed)
Added to previous encounter  

## 2019-04-04 NOTE — Telephone Encounter (Signed)
Merton Border to McLean-Scocuzza, Nino Glow, MD    04/04/19 12:35 PM I reached out to [employer] who handles our [employer] [place] and they said if you bill early for a annual physical then they will likely not pay. If it's billed out as something else like a follow up then maybe they will pay. I would just say that whatever you do I don't want to get stuck with a bill. Thanks, Duran Turbeville

## 2019-04-29 ENCOUNTER — Encounter: Payer: Self-pay | Admitting: Dermatology

## 2019-04-29 ENCOUNTER — Ambulatory Visit: Payer: BC Managed Care – PPO | Admitting: Dermatology

## 2019-04-29 ENCOUNTER — Other Ambulatory Visit: Payer: Self-pay

## 2019-04-29 DIAGNOSIS — D485 Neoplasm of uncertain behavior of skin: Secondary | ICD-10-CM

## 2019-04-29 DIAGNOSIS — Z1283 Encounter for screening for malignant neoplasm of skin: Secondary | ICD-10-CM

## 2019-04-29 DIAGNOSIS — C44319 Basal cell carcinoma of skin of other parts of face: Secondary | ICD-10-CM | POA: Diagnosis not present

## 2019-04-29 DIAGNOSIS — M9902 Segmental and somatic dysfunction of thoracic region: Secondary | ICD-10-CM | POA: Diagnosis not present

## 2019-04-29 DIAGNOSIS — D1800 Hemangioma unspecified site: Secondary | ICD-10-CM

## 2019-04-29 DIAGNOSIS — M5412 Radiculopathy, cervical region: Secondary | ICD-10-CM | POA: Diagnosis not present

## 2019-04-29 DIAGNOSIS — L821 Other seborrheic keratosis: Secondary | ICD-10-CM

## 2019-04-29 DIAGNOSIS — L738 Other specified follicular disorders: Secondary | ICD-10-CM

## 2019-04-29 DIAGNOSIS — L814 Other melanin hyperpigmentation: Secondary | ICD-10-CM | POA: Diagnosis not present

## 2019-04-29 DIAGNOSIS — M9903 Segmental and somatic dysfunction of lumbar region: Secondary | ICD-10-CM | POA: Diagnosis not present

## 2019-04-29 DIAGNOSIS — Z85828 Personal history of other malignant neoplasm of skin: Secondary | ICD-10-CM

## 2019-04-29 DIAGNOSIS — Z86018 Personal history of other benign neoplasm: Secondary | ICD-10-CM

## 2019-04-29 DIAGNOSIS — M9901 Segmental and somatic dysfunction of cervical region: Secondary | ICD-10-CM | POA: Diagnosis not present

## 2019-04-29 DIAGNOSIS — D229 Melanocytic nevi, unspecified: Secondary | ICD-10-CM

## 2019-04-29 NOTE — Progress Notes (Signed)
   Follow-Up Visit   Subjective  Kevin Ward is a 54 y.o. male who presents for the following: Annual Exam (History of BCC and dysplastic nevi) and Other (Dry spots of scalp.). Patient presents for skin cancer screening and total-body skin exam.   The following portions of the chart were reviewed this encounter and updated as appropriate: Tobacco  Allergies  Meds  Problems  Med Hx  Surg Hx  Fam Hx      Review of Systems: No other skin or systemic complaints.  Objective  Well appearing patient in no apparent distress; mood and affect are within normal limits.  A full examination was performed including scalp, head, eyes, ears, nose, lips, neck, chest, axillae, abdomen, back, buttocks, bilateral upper extremities, bilateral lower extremities, hands, feet, fingers, toes, fingernails, and toenails. All findings within normal limits unless otherwise noted below.  Objective  Right Eyebrow: Well healed scar with no evidence of recurrence.   Objective  Multiple: Scar with no evidence of recurrence.   Objective  Left Forehead 2.5 cm above mid brow: Pearly pink papule  Objective  Head - Anterior (Face): Small yellow papules with a central dell.   Assessment & Plan   Skin cancer screening performed today.  Seborrheic Keratoses - Stuck-on, waxy, tan-brown papules and plaques  - Discussed benign etiology and prognosis. - Observe - Call for any changes  Lentigines - Scattered tan macules - Discussed due to sun exposure - Benign, observe - Call for any changes  Hemangiomas - Red papules - Discussed benign nature - Observe - Call for any changes  Melanocytic Nevi - Tan-brown and/or pink-flesh-colored symmetric macules and papules - Benign appearing on exam today - Observation - Call clinic for new or changing moles - Recommend daily use of broad spectrum spf 30+ sunscreen to sun-exposed areas.    History of basal cell carcinoma (BCC) Right Eyebrow  Clear  today. Observe   History of dysplastic nevus Multiple  Clear today. Observe   Neoplasm of uncertain behavior of skin Left Forehead 2.5 cm above mid brow  Skin / nail biopsy Type of biopsy: tangential   Informed consent: discussed and consent obtained   Timeout: patient name, date of birth, surgical site, and procedure verified   Procedure prep:  Patient was prepped and draped in usual sterile fashion Prep type:  Isopropyl alcohol Anesthesia: the lesion was anesthetized in a standard fashion   Anesthetic:  1% lidocaine w/ epinephrine 1-100,000 nerve block Instrument used: flexible razor blade   Hemostasis achieved with: pressure, aluminum chloride and electrodesiccation   Outcome: patient tolerated procedure well   Post-procedure details: sterile dressing applied and wound care instructions given   Dressing type: bandage and petrolatum    Specimen 1 - Surgical pathology Differential Diagnosis: BCC vs other Check Margins: No 0.6 cm Pearly pink papule  Sebaceous hyperplasia Head - Anterior (Face)  Observe   Return in about 6 months (around 10/29/2019).   I, Ashok Cordia, CMA, am acting as scribe for Sarina Ser, MD . Documentation: I have reviewed the above documentation for accuracy and completeness, and I agree with the above.  Sarina Ser, MD

## 2019-04-29 NOTE — Patient Instructions (Signed)

## 2019-04-30 NOTE — Telephone Encounter (Signed)
error 

## 2019-05-02 ENCOUNTER — Telehealth: Payer: Self-pay

## 2019-05-02 NOTE — Telephone Encounter (Signed)
-----   Message from Ralene Bathe, MD sent at 04/30/2019  5:38 PM EDT ----- Skin , left forehead 2.5cm above mid brow BASAL CELL CARCINOMA, MICRONODULAR PATTERN  Cancer- BCC Schedule surgery

## 2019-05-02 NOTE — Telephone Encounter (Signed)
Advised patient of results and scheduled surgery appointment.

## 2019-05-27 DIAGNOSIS — M9901 Segmental and somatic dysfunction of cervical region: Secondary | ICD-10-CM | POA: Diagnosis not present

## 2019-05-27 DIAGNOSIS — M9903 Segmental and somatic dysfunction of lumbar region: Secondary | ICD-10-CM | POA: Diagnosis not present

## 2019-05-27 DIAGNOSIS — M9902 Segmental and somatic dysfunction of thoracic region: Secondary | ICD-10-CM | POA: Diagnosis not present

## 2019-05-27 DIAGNOSIS — M5412 Radiculopathy, cervical region: Secondary | ICD-10-CM | POA: Diagnosis not present

## 2019-06-18 ENCOUNTER — Encounter: Payer: BLUE CROSS/BLUE SHIELD | Admitting: Internal Medicine

## 2019-06-20 DIAGNOSIS — K219 Gastro-esophageal reflux disease without esophagitis: Secondary | ICD-10-CM | POA: Diagnosis not present

## 2019-06-20 DIAGNOSIS — Z6826 Body mass index (BMI) 26.0-26.9, adult: Secondary | ICD-10-CM | POA: Diagnosis not present

## 2019-06-25 ENCOUNTER — Encounter: Payer: Self-pay | Admitting: Internal Medicine

## 2019-06-25 ENCOUNTER — Ambulatory Visit (INDEPENDENT_AMBULATORY_CARE_PROVIDER_SITE_OTHER): Payer: BC Managed Care – PPO | Admitting: Dermatology

## 2019-06-25 ENCOUNTER — Other Ambulatory Visit: Payer: Self-pay | Admitting: Internal Medicine

## 2019-06-25 ENCOUNTER — Other Ambulatory Visit: Payer: Self-pay

## 2019-06-25 DIAGNOSIS — E785 Hyperlipidemia, unspecified: Secondary | ICD-10-CM

## 2019-06-25 DIAGNOSIS — C4491 Basal cell carcinoma of skin, unspecified: Secondary | ICD-10-CM

## 2019-06-25 DIAGNOSIS — E559 Vitamin D deficiency, unspecified: Secondary | ICD-10-CM

## 2019-06-25 DIAGNOSIS — C44319 Basal cell carcinoma of skin of other parts of face: Secondary | ICD-10-CM | POA: Diagnosis not present

## 2019-06-25 DIAGNOSIS — E291 Testicular hypofunction: Secondary | ICD-10-CM

## 2019-06-25 DIAGNOSIS — E538 Deficiency of other specified B group vitamins: Secondary | ICD-10-CM

## 2019-06-25 HISTORY — DX: Basal cell carcinoma of skin, unspecified: C44.91

## 2019-06-25 MED ORDER — MUPIROCIN 2 % EX OINT: 1.0000 "application " | TOPICAL_OINTMENT | Freq: Every day | CUTANEOUS | 0 refills | Status: DC

## 2019-06-25 NOTE — Progress Notes (Signed)
   Follow-Up Visit   Subjective  Kevin Ward is a 54 y.o. male who presents for the following: Basal Cell Carcinoma, biopsy-proven (left forehead - Excise today).    The following portions of the chart were reviewed this encounter and updated as appropriate:  Tobacco  Allergies  Meds  Problems  Med Hx  Surg Hx  Fam Hx      Review of Systems:  No other skin or systemic complaints except as noted in HPI or Assessment and Plan.  Objective  Well appearing patient in no apparent distress; mood and affect are within normal limits.  A focused examination was performed including face. Relevant physical exam findings are noted in the Assessment and Plan.  Objective  Left Forehead 2.5 cm above mid brow: Healing biopsy site.   Assessment & Plan  Basal cell carcinoma (BCC) of skin of other part of face Left Forehead 2.5 cm above mid brow-biopsy-proven  Skin excision  Lesion length (cm):  0.8 Lesion width (cm):  0.8 Margin per side (cm):  0.2 Total excision diameter (cm):  1.2 Informed consent: discussed and consent obtained   Timeout: patient name, date of birth, surgical site, and procedure verified   Procedure prep:  Patient was prepped and draped in usual sterile fashion Prep type:  Isopropyl alcohol and povidone-iodine Anesthesia: the lesion was anesthetized in a standard fashion   Anesthetic:  1% lidocaine w/ epinephrine 1-100,000 buffered w/ 8.4% NaHCO3 Instrument used: #15 blade   Hemostasis achieved with: pressure   Hemostasis achieved with comment:  Electrocautery Outcome: patient tolerated procedure well with no complications   Post-procedure details: sterile dressing applied and wound care instructions given   Dressing type: bandage and pressure dressing (mupirocin)    Skin repair Complexity:  Complex Final length (cm):  3 Reason for type of repair: reduce tension to allow closure, reduce the risk of dehiscence, infection, and necrosis, reduce subcutaneous  dead space and avoid a hematoma, allow closure of the large defect, preserve normal anatomy, preserve normal anatomical and functional relationships and enhance both functionality and cosmetic results   Undermining: area extensively undermined   Undermining comment:  Undermining defect 1.2 cm Subcutaneous layers (deep stitches):  Suture size:  4-0 Suture type: Vicryl (polyglactin 910)   Subcutaneous suture technique: inverted dermal. Fine/surface layer approximation (top stitches):  Suture size:  5-0 Suture type comment:  Nylon Stitches: simple running   Suture removal (days):  7 Hemostasis achieved with: suture and pressure Outcome: patient tolerated procedure well with no complications   Post-procedure details: sterile dressing applied and wound care instructions given   Dressing type: bandage and pressure dressing (mupirocin)   Additional details:  Doryx 200mg  1/2 tablet po bid with food and plenty of fluid x 5 days - samples given (Lot # [id number] Exp 05/2021)  mupirocin ointment (BACTROBAN) 2 %  Specimen 1 - Surgical pathology Differential Diagnosis: BCC Check Margins: Yes 0.8 cm Healing biopsy site. [phone]  Return in about 1 week (around 07/02/2019) for suture removal.  I, Ashok Cordia, CMA, am acting as scribe for Sarina Ser, MD .  Documentation: I have reviewed the above documentation for accuracy and completeness, and I agree with the above.  Sarina Ser, MD

## 2019-06-25 NOTE — Patient Instructions (Signed)

## 2019-06-26 ENCOUNTER — Encounter: Payer: Self-pay | Admitting: Dermatology

## 2019-06-28 ENCOUNTER — Other Ambulatory Visit: Payer: Self-pay

## 2019-06-28 ENCOUNTER — Other Ambulatory Visit (INDEPENDENT_AMBULATORY_CARE_PROVIDER_SITE_OTHER): Payer: BC Managed Care – PPO

## 2019-06-28 DIAGNOSIS — Z125 Encounter for screening for malignant neoplasm of prostate: Secondary | ICD-10-CM | POA: Diagnosis not present

## 2019-06-28 DIAGNOSIS — E291 Testicular hypofunction: Secondary | ICD-10-CM

## 2019-06-28 DIAGNOSIS — E785 Hyperlipidemia, unspecified: Secondary | ICD-10-CM | POA: Diagnosis not present

## 2019-06-28 DIAGNOSIS — E559 Vitamin D deficiency, unspecified: Secondary | ICD-10-CM

## 2019-06-28 DIAGNOSIS — Z1329 Encounter for screening for other suspected endocrine disorder: Secondary | ICD-10-CM

## 2019-06-28 DIAGNOSIS — Z1389 Encounter for screening for other disorder: Secondary | ICD-10-CM

## 2019-06-28 DIAGNOSIS — E538 Deficiency of other specified B group vitamins: Secondary | ICD-10-CM | POA: Diagnosis not present

## 2019-06-28 DIAGNOSIS — Z0000 Encounter for general adult medical examination without abnormal findings: Secondary | ICD-10-CM | POA: Diagnosis not present

## 2019-06-28 LAB — COMPREHENSIVE METABOLIC PANEL
ALT: 19 U/L (ref 0–53)
AST: 19 U/L (ref 0–37)
Albumin: 4.7 g/dL (ref 3.5–5.2)
Alkaline Phosphatase: 76 U/L (ref 39–117)
BUN: 18 mg/dL (ref 6–23)
CO2: 32 mEq/L (ref 19–32)
Calcium: 9.7 mg/dL (ref 8.4–10.5)
Chloride: 100 mEq/L (ref 96–112)
Creatinine, Ser: 1 mg/dL (ref 0.40–1.50)
GFR: 78.03 mL/min (ref >60.00)
Glucose, Bld: 93 mg/dL (ref 70–99)
Potassium: 4.2 mEq/L (ref 3.5–5.1)
Sodium: 138 mEq/L (ref 135–145)
Total Bilirubin: 0.5 mg/dL (ref 0.2–1.2)
Total Protein: 7.1 g/dL (ref 6.0–8.3)

## 2019-06-28 LAB — LIPID PANEL
Cholesterol: 221 mg/dL — ABNORMAL HIGH (ref 0–200)
HDL: 44.9 mg/dL (ref >39.00)
LDL Cholesterol: 140 mg/dL — ABNORMAL HIGH (ref 0–99)
NonHDL: 175.7
Total CHOL/HDL Ratio: 5
Triglycerides: 177 mg/dL — ABNORMAL HIGH (ref 0.0–149.0)
VLDL: 35.4 mg/dL (ref 0.0–40.0)

## 2019-06-28 LAB — VITAMIN D 25 HYDROXY (VIT D DEFICIENCY, FRACTURES): VITD: 57.37 ng/mL (ref 30.00–100.00)

## 2019-06-28 LAB — CBC WITH DIFFERENTIAL/PLATELET
Basophils Absolute: 0 10*3/uL (ref 0.0–0.1)
Basophils Relative: 0.7 % (ref 0.0–3.0)
Eosinophils Absolute: 0.2 10*3/uL (ref 0.0–0.7)
Eosinophils Relative: 2.7 % (ref 0.0–5.0)
HCT: 40.7 % (ref 39.0–52.0)
Hemoglobin: 14 g/dL (ref 13.0–17.0)
Lymphocytes Relative: 31.5 % (ref 12.0–46.0)
Lymphs Abs: 1.8 10*3/uL (ref 0.7–4.0)
MCHC: 34.4 g/dL (ref 30.0–36.0)
MCV: 86.4 fl (ref 78.0–100.0)
Monocytes Absolute: 0.5 10*3/uL (ref 0.1–1.0)
Monocytes Relative: 8.8 % (ref 3.0–12.0)
Neutro Abs: 3.3 10*3/uL (ref 1.4–7.7)
Neutrophils Relative %: 56.3 % (ref 43.0–77.0)
Platelets: 230 10*3/uL (ref 150.0–400.0)
RBC: 4.71 Mil/uL (ref 4.22–5.81)
RDW: 13 % (ref 11.5–15.5)
WBC: 5.8 10*3/uL (ref 4.0–10.5)

## 2019-06-28 LAB — VITAMIN B12: Vitamin B-12: 315 pg/mL (ref 211–911)

## 2019-06-28 LAB — TSH: TSH: 2.21 u[IU]/mL (ref 0.35–4.50)

## 2019-06-28 LAB — PSA: PSA: 0.59 ng/mL (ref 0.10–4.00)

## 2019-06-29 LAB — URINALYSIS, ROUTINE W REFLEX MICROSCOPIC
Bilirubin Urine: NEGATIVE
Glucose, UA: NEGATIVE
Hgb urine dipstick: NEGATIVE
Ketones, ur: NEGATIVE
Leukocytes,Ua: NEGATIVE
Nitrite: NEGATIVE
Protein, ur: NEGATIVE
Specific Gravity, Urine: 1.019 (ref 1.001–1.03)
pH: 7 (ref 5.0–8.0)

## 2019-07-01 LAB — TESTOSTERONE TOTAL,FREE,BIO, MALES
Albumin: 4.6 g/dL (ref 3.6–5.1)
Sex Hormone Binding: 10 nmol/L (ref 10–50)
Testosterone: 243 ng/dL — ABNORMAL LOW (ref 250–827)

## 2019-07-02 ENCOUNTER — Other Ambulatory Visit: Payer: Self-pay

## 2019-07-02 ENCOUNTER — Ambulatory Visit (INDEPENDENT_AMBULATORY_CARE_PROVIDER_SITE_OTHER): Payer: BC Managed Care – PPO | Admitting: Dermatology

## 2019-07-02 DIAGNOSIS — Z85828 Personal history of other malignant neoplasm of skin: Secondary | ICD-10-CM

## 2019-07-02 DIAGNOSIS — M9901 Segmental and somatic dysfunction of cervical region: Secondary | ICD-10-CM | POA: Diagnosis not present

## 2019-07-02 DIAGNOSIS — Z4802 Encounter for removal of sutures: Secondary | ICD-10-CM

## 2019-07-02 DIAGNOSIS — M5412 Radiculopathy, cervical region: Secondary | ICD-10-CM | POA: Diagnosis not present

## 2019-07-02 DIAGNOSIS — L578 Other skin changes due to chronic exposure to nonionizing radiation: Secondary | ICD-10-CM

## 2019-07-02 DIAGNOSIS — M9902 Segmental and somatic dysfunction of thoracic region: Secondary | ICD-10-CM | POA: Diagnosis not present

## 2019-07-02 DIAGNOSIS — M9903 Segmental and somatic dysfunction of lumbar region: Secondary | ICD-10-CM | POA: Diagnosis not present

## 2019-07-02 NOTE — Progress Notes (Signed)
   Follow-Up Visit   Subjective  Kevin Ward is a 54 y.o. male who presents for the following: suture removal (patient is here today for suture removal of margins free BCC at the left forehead 2.5 cm above the brow).  The following portions of the chart were reviewed this encounter and updated as appropriate:  Tobacco  Allergies  Meds  Problems  Med Hx  Surg Hx  Fam Hx     Review of Systems:  No other skin or systemic complaints except as noted in HPI or Assessment and Plan.  Objective  Well appearing patient in no apparent distress; mood and affect are within normal limits.  A focused examination was performed including the face . Relevant physical exam findings are noted in the Assessment and Plan.  Objective  left forehead 2.5 cm above the brow: Healing excision site   Assessment & Plan    History of basal cell carcinoma (BCC) left forehead 2.5 cm above the brow  Encounter for Removal of Sutures - Incision site at the left forehead 2.5 cm above the brow is clean, dry and intact - Wound cleansed, sutures removed, wound cleansed and steri strips applied.  - Discussed pathology results showing margins free BCC - Patient advised to keep steri-strips dry until they fall off. - Scars remodel for a full year. - Once steri-strips fall off, patient can apply over-the-counter silicone scar cream each night to help with scar remodeling if desired. - Patient advised to call with any concerns or if they notice any new or changing lesions.  Actinic Damage - diffuse scaly erythematous macules with underlying dyspigmentation - Recommend daily broad spectrum sunscreen SPF 30+ to sun-exposed areas, reapply every 2 hours as needed.  - Call for new or changing lesions.   Return for appointment as scheduled.  Luther Redo, CMA, am acting as scribe for Sarina Ser, MD . Documentation: I have reviewed the above documentation for accuracy and completeness, and I agree with  the above.  Sarina Ser, MD

## 2019-07-03 ENCOUNTER — Telehealth: Payer: Self-pay | Admitting: Internal Medicine

## 2019-07-03 NOTE — Telephone Encounter (Signed)
Pt returned your call about results °

## 2019-07-04 DIAGNOSIS — R7989 Other specified abnormal findings of blood chemistry: Secondary | ICD-10-CM | POA: Insufficient documentation

## 2019-07-04 DIAGNOSIS — E291 Testicular hypofunction: Secondary | ICD-10-CM | POA: Insufficient documentation

## 2019-07-04 NOTE — Addendum Note (Signed)
Addended by: Orland Mustard on: 07/04/2019 06:27 PM   Modules accepted: Orders

## 2019-07-06 ENCOUNTER — Encounter: Payer: Self-pay | Admitting: Dermatology

## 2019-07-17 ENCOUNTER — Other Ambulatory Visit: Payer: Self-pay

## 2019-07-17 ENCOUNTER — Encounter: Payer: Self-pay | Admitting: Urology

## 2019-07-17 ENCOUNTER — Ambulatory Visit: Payer: BC Managed Care – PPO | Admitting: Urology

## 2019-07-17 VITALS — BP 150/90 | HR 70 | Ht 73.0 in | Wt 201.8 lb

## 2019-07-17 DIAGNOSIS — R7989 Other specified abnormal findings of blood chemistry: Secondary | ICD-10-CM

## 2019-07-17 DIAGNOSIS — E291 Testicular hypofunction: Secondary | ICD-10-CM | POA: Diagnosis not present

## 2019-07-17 NOTE — Progress Notes (Signed)
07/17/19 7:57 AM   [place] [birth year] [id number]  Referring provider: McLean-Scocuzza, Nino Glow, MD [place],  [place] [address] Chief Complaint  Patient presents with   Hypogonadism    HPI: Kevin Ward is a 54 y.o. male who presents at the request of Dr. Terese Door for for evaluation and management of hypogonadism.  -At his annual physical, he complained of tiredness, fatigue and asked to have his testosterone level checked.  -Recent testosterone level 06/28/19 was 13 -Was diagnosed with low testosterone approximately 15 years ago and took androgel for only two months -Symps of decreased energy, decreased libido, and mild ED -5/10+ responses ADAM questionnaire including decreased libido, lack of energy, decreased strength, ED and falling asleep after there -SHIM 16/25 indicating mild-moderate ED -No bothersome urinary symptoms  PMH: Past Medical History:  Diagnosis Date   Allergy    declined allergy shots in past used allergy drops not helpful; mold, grasses, dust   Basal cell carcinoma    right eyebrow   COVID-19    11/23/18   Dysplastic nevus 05/14/2012   Right lower back. Mild atypia, deep margin involved.   Dysplastic nevus 05/14/2012   Left upper back. Mild atypia. Limited margins free.   Dysplastic nevus 06/25/2013   Right inf. lat. buttock. Moderate atypia. Margins involved.   Dysplastic nevus 06/25/2013   Right proximal anterior thigh. Mild atypia. Margins close   Dysplastic nevus 10/05/2015   Post. neck left paraspinal. Mild atypia. Lateral margin involved.   Hemorrhoids    Hyperlipidemia    Stye    right    Surgical History: Past Surgical History:  Procedure Laterality Date   FOOT SURGERY     morton neuroma right foot Dr. Milinda Pointer 2015 and left foot 15-20 years prior had neuropathy sx's   [facility]    Home Medications:  Allergies as of 07/17/2019   No  Known Allergies     Medication List       Accurate as of July 17, 2019 11:59 PM. If you have any questions, ask your nurse or doctor.        esomeprazole 40 MG capsule Commonly known as: NEXIUM Take 40 mg by mouth daily.   MULTI-VITAMIN DAILY PO Take by mouth.   mupirocin ointment 2 % Commonly known as: BACTROBAN Apply 1 application topically daily. With dressing changes   triamcinolone cream 0.1 % Commonly known as: KENALOG APPLY TO AFFECTED AREA OF BITE TWICE A DAY ON LEG UNTIL CLEAR, [place], [place], AXILLA   UNABLE TO FIND Med Name: doterra supplement       Allergies: No Known Allergies  Family History: Family History  Problem Relation Age of Onset   Cancer Mother        breast   Hypertension Mother    Diabetes Mother    Alcohol abuse Father    Hyperlipidemia Brother    Depression Son    Cancer Brother        kidney cancer    Social History:  reports that he has never smoked. He has quit using smokeless tobacco. He reports that he does not drink alcohol and does not use drugs.   Physical Exam: BP (!) 150/90    Pulse 70    Ht 6\' 1"  (1.854 m)    Wt 201 lb 12.8 oz (91.5 kg)    BMI 26.62 kg/m   Constitutional:  Well nourished.  Alert and oriented, No acute distress. HEENT: [place] AT, moist mucus membranes.  Trachea midline, no masses. Cardiovascular: No clubbing, cyanosis, or edema. Respiratory: Normal respiratory effort, no increased work of breathing.   GU: Phallus without lesions, testes descended bilaterally without masses or tenderness. Right testis size estimated at 20cc left/15cc. Skin: No rashes, bruises or suspicious lesions. Lymph: No  inguinal adenopathy. Neurologic: Grossly intact, no focal deficits, moving all 4 extremities. Psychiatric: Normal mood and affect.  Laboratory Data:  Lab Results  Component Value Date   CREATININE 1.00 06/28/2019    Lab Results  Component Value Date   PSA 0.59 06/28/2019   PSA 0.66 03/15/2019   PSA  0.68 03/14/2018    Lab Results  Component Value Date   TESTOSTERONE 243 (L) 06/28/2019    Assessment & Plan:    1. Hypogonadism -Schedule repeat testosterone and LH and prolactin -Replacement options were discussed including testosterone injections. He does not desire to restart topical preparation. We also discussed clomid, subcutaneous testosterone, and oral testosterone.  -We also discussed that insurance companies require 2 AM testosterone levels drawn before 10 AM and he was scheduled for lab visit for testosterone, LH and prolactin. -Potential side effects of testosterone replacement were discussed including stimulation of benign prostatic growth with lower urinary tract symptoms; erythrocytosis; edema; gynecomastia; worsening sleep apnea; venous thromboembolism; testicular atrophy and infertility. Recent studies suggesting an increased incidence of heart attack and stroke in patients taking testosterone was discussed. He was informed there is conflicting evidence regarding the impact of testosterone therapy on cardiovascular risk. The theoretical risk of growth stimulation of an undetected prostate cancer was also discussed.  He was informed that current evidence does not provide any definitive answers regarding the risks of testosterone therapy on prostate cancer and cardiovascular disease. The need for periodic monitoring of his testosterone level, PSA, hematocrit and DRE was discussed.   Kevin Ward [address], [place], [place] [address] [phone]  I, Kevin Ward, am acting as a Education administrator for Dr. Nicki Reaper C. Jarmel Linhardt.  I have reviewed the above documentation for accuracy and completeness, and I agree with the above.   Kevin Sons, MD

## 2019-07-18 ENCOUNTER — Other Ambulatory Visit: Payer: BC Managed Care – PPO

## 2019-07-18 ENCOUNTER — Other Ambulatory Visit: Payer: Self-pay

## 2019-07-18 ENCOUNTER — Encounter: Payer: Self-pay | Admitting: Urology

## 2019-07-18 DIAGNOSIS — R7989 Other specified abnormal findings of blood chemistry: Secondary | ICD-10-CM

## 2019-07-19 ENCOUNTER — Encounter: Payer: Self-pay | Admitting: Urology

## 2019-07-19 ENCOUNTER — Telehealth: Payer: Self-pay | Admitting: Urology

## 2019-07-19 DIAGNOSIS — R7989 Other specified abnormal findings of blood chemistry: Secondary | ICD-10-CM

## 2019-07-19 LAB — TESTOSTERONE: Testosterone: 178 ng/dL — ABNORMAL LOW (ref 264–916)

## 2019-07-19 LAB — PROLACTIN: Prolactin: 8.5 ng/mL (ref 4.0–15.2)

## 2019-07-19 LAB — LUTEINIZING HORMONE: LH: 5.3 m[IU]/mL (ref 1.7–8.6)

## 2019-07-19 LAB — HEMATOCRIT: Hematocrit: 41.6 % (ref 37.5–51.0)

## 2019-07-19 NOTE — Telephone Encounter (Signed)
Repeat testosterone level low at 179.  LH and prolactin were normal.  We discussed replacement options earlier this week.  He did not desire topical preparations.  Most likely insurance would cover testosterone injections.  He would also be a candidate for off label use of oral Clomid.  If he was interested in subcutaneous testosterone Berdine Addison) or oral testosterone we could see if this was covered by his insurance company.

## 2019-07-23 MED ORDER — CLOMIPHENE CITRATE 50 MG PO TABS: 25.0000 mg | ORAL_TABLET | Freq: Every day | ORAL | 1 refills | Status: DC

## 2019-07-23 MED ORDER — CLOMIPHENE CITRATE 50 MG PO TABS
25.0000 mg | ORAL_TABLET | Freq: Every day | ORAL | 0 refills | Status: DC
Start: 2019-07-23 — End: 2019-07-23

## 2019-07-23 NOTE — Telephone Encounter (Signed)
Patient notified and would like to try Clomid.

## 2019-07-23 NOTE — Telephone Encounter (Addendum)
Patient notified and voiced understanding. RX was changed to [facility]. Lab appointment made.

## 2019-07-23 NOTE — Telephone Encounter (Signed)
Rx was sent.  Will need a follow-up visit with testosterone level in 6 weeks.  If the Clomid is too expensive at [facility] tell him not to pick up and we can send a Rx to [facility] with a good Rx discount

## 2019-07-23 NOTE — Addendum Note (Signed)
Addended by: Kyra Manges on: 07/23/2019 10:22 AM   Modules accepted: Orders

## 2019-07-30 ENCOUNTER — Encounter: Payer: Self-pay | Admitting: Dermatology

## 2019-07-30 DIAGNOSIS — M5412 Radiculopathy, cervical region: Secondary | ICD-10-CM | POA: Diagnosis not present

## 2019-07-30 DIAGNOSIS — M9902 Segmental and somatic dysfunction of thoracic region: Secondary | ICD-10-CM | POA: Diagnosis not present

## 2019-07-30 DIAGNOSIS — M9901 Segmental and somatic dysfunction of cervical region: Secondary | ICD-10-CM | POA: Diagnosis not present

## 2019-07-30 DIAGNOSIS — M9903 Segmental and somatic dysfunction of lumbar region: Secondary | ICD-10-CM | POA: Diagnosis not present

## 2019-08-27 DIAGNOSIS — M5412 Radiculopathy, cervical region: Secondary | ICD-10-CM | POA: Diagnosis not present

## 2019-08-27 DIAGNOSIS — M9903 Segmental and somatic dysfunction of lumbar region: Secondary | ICD-10-CM | POA: Diagnosis not present

## 2019-08-27 DIAGNOSIS — M9902 Segmental and somatic dysfunction of thoracic region: Secondary | ICD-10-CM | POA: Diagnosis not present

## 2019-08-27 DIAGNOSIS — M9901 Segmental and somatic dysfunction of cervical region: Secondary | ICD-10-CM | POA: Diagnosis not present

## 2019-08-30 ENCOUNTER — Other Ambulatory Visit: Payer: BC Managed Care – PPO

## 2019-08-30 ENCOUNTER — Other Ambulatory Visit: Payer: Self-pay

## 2019-08-30 DIAGNOSIS — E291 Testicular hypofunction: Secondary | ICD-10-CM | POA: Diagnosis not present

## 2019-08-30 DIAGNOSIS — R7989 Other specified abnormal findings of blood chemistry: Secondary | ICD-10-CM | POA: Diagnosis not present

## 2019-08-31 LAB — TESTOSTERONE: Testosterone: 574 ng/dL (ref 264–916)

## 2019-09-01 ENCOUNTER — Telehealth: Payer: Self-pay | Admitting: Urology

## 2019-09-01 NOTE — Telephone Encounter (Signed)
Testosterone level was 574.  Have his low testosterone symptoms improved?

## 2019-09-03 NOTE — Telephone Encounter (Signed)
Spoke to patient and he will try the Testosterone Cypionate.

## 2019-09-03 NOTE — Telephone Encounter (Signed)
Patient notified and states he has not seen a difference in his symptoms.

## 2019-09-03 NOTE — Telephone Encounter (Signed)
The next step would be testosterone injections either IM injections or Xyosted.  Would need prior authorization on [place]

## 2019-09-05 MED ORDER — TESTOSTERONE CYPIONATE 200 MG/ML IM SOLN
200.0000 mg | INTRAMUSCULAR | 0 refills | Status: DC
Start: 2019-09-05 — End: 2019-12-16

## 2019-09-05 NOTE — Telephone Encounter (Signed)
Rx sent.  Will need an appointment for injection training and a testosterone level with follow-up 4-6 weeks

## 2019-09-05 NOTE — Addendum Note (Signed)
Addended by: Abbie Sons on: 09/05/2019 04:40 PM   Modules accepted: Orders

## 2019-09-06 NOTE — Telephone Encounter (Signed)
Patient will contact our office to schedule an appointment for injection teaching when he has the medication.

## 2019-09-10 ENCOUNTER — Encounter: Payer: Self-pay | Admitting: Urology

## 2019-09-17 ENCOUNTER — Ambulatory Visit (INDEPENDENT_AMBULATORY_CARE_PROVIDER_SITE_OTHER): Payer: BC Managed Care – PPO | Admitting: Physician Assistant

## 2019-09-17 ENCOUNTER — Other Ambulatory Visit: Payer: Self-pay

## 2019-09-17 ENCOUNTER — Encounter: Payer: Self-pay | Admitting: Physician Assistant

## 2019-09-17 VITALS — BP 146/77 | HR 66 | Ht 73.0 in | Wt 205.2 lb

## 2019-09-17 DIAGNOSIS — E291 Testicular hypofunction: Secondary | ICD-10-CM | POA: Diagnosis not present

## 2019-09-17 MED ORDER — SYRINGE 2-3 ML 3 ML MISC: 2 refills | Status: DC

## 2019-09-17 MED ORDER — "BD ECLIPSE SHIELDED NEEDLE 18G X 1-1/2"" MISC": 1 refills | Status: DC

## 2019-09-17 MED ORDER — "BD ECLIPSE NEEDLE 21G X 1-1/2"" MISC": 1 refills | Status: DC

## 2019-09-17 NOTE — Progress Notes (Signed)
Patient presents today for Testosterone injection teaching. Patient was instructed on how to properly use the 18guage needle to draw up 1cc of the testosterone, into 3cc syringe then changed the needle to the 21guage for injection. Patient then cleaned the vastus lateralis with an alcohol swab and injected the site with bevel up.   Patient dose: 52mL (200mg ) Lot Number:[id number] Expiration date:01/2022 Location: Right  Patient verbalized understanding current dose is 45ml every 14days unless instructed by a provider.  Patient tolerated well.  Patient understood how to dispose of sharps properly and store medication.   Performed by: Debroah Loop, PA-C

## 2019-09-17 NOTE — Patient Instructions (Signed)

## 2019-09-24 DIAGNOSIS — M5412 Radiculopathy, cervical region: Secondary | ICD-10-CM | POA: Diagnosis not present

## 2019-09-24 DIAGNOSIS — M9902 Segmental and somatic dysfunction of thoracic region: Secondary | ICD-10-CM | POA: Diagnosis not present

## 2019-09-24 DIAGNOSIS — M9901 Segmental and somatic dysfunction of cervical region: Secondary | ICD-10-CM | POA: Diagnosis not present

## 2019-09-24 DIAGNOSIS — M9903 Segmental and somatic dysfunction of lumbar region: Secondary | ICD-10-CM | POA: Diagnosis not present

## 2019-10-22 ENCOUNTER — Other Ambulatory Visit: Payer: Self-pay

## 2019-10-22 ENCOUNTER — Other Ambulatory Visit: Payer: BC Managed Care – PPO

## 2019-10-22 DIAGNOSIS — E291 Testicular hypofunction: Secondary | ICD-10-CM

## 2019-10-23 LAB — TESTOSTERONE: Testosterone: 698 ng/dL (ref 264–916)

## 2019-10-24 NOTE — Progress Notes (Signed)
10/25/2019 9:36 AM   Kevin Ward April 26, 1965 [id number]  Referring provider: McLean-Scocuzza, Nino Glow, MD [place],  [place] [address] Chief Complaint  Patient presents with  . Hypogonadism    HPI: Kevin Ward is a 54 y.o. male who returns for a 4 month follow up of hypogonadism.   -Was diagnosed with low testosterone approximately 15 years ago and took androgel for only two months -Last visit on 07/17/2019 he noted decreased energy, decreased libido, and mild ED -5/10+ responses ADAM questionnaire including decreased libido, lack of energy, decreased strength, ED and falling asleep after there -SHIM 16/25 indicating mild-moderate ED -No bothersome urinary symptoms -He had testosterone injection teaching with Zara Council, PA-C on 09/17/2019.  -Recent testosterone 698 ng/dL on 10/22/2019.  -He is doing well on testosterone injections every 2 weeks. Total of 3 injections so far.  -His symptoms have mildly improve and his energy is improving.  -He still has ED issues. He has tried Viagra 20 years ago.     PMH: Past Medical History:  Diagnosis Date  . Allergy    declined allergy shots in past used allergy drops not helpful; mold, grasses, dust  . Basal cell carcinoma    right eyebrow  . COVID-19    11/23/18  . Dysplastic nevus 05/14/2012   Right lower back. Mild atypia, deep margin involved.  Marland Kitchen Dysplastic nevus 05/14/2012   Left upper back. Mild atypia. Limited margins free.  Marland Kitchen Dysplastic nevus 06/25/2013   Right inf. lat. buttock. Moderate atypia. Margins involved.  Marland Kitchen Dysplastic nevus 06/25/2013   Right proximal anterior thigh. Mild atypia. Margins close  . Dysplastic nevus 10/05/2015   Post. neck left paraspinal. Mild atypia. Lateral margin involved.  . Hemorrhoids   . Hyperlipidemia   . Stye    right    Surgical History: Past Surgical History:  Procedure Laterality Date  . FOOT SURGERY     morton neuroma right foot Dr. Milinda Pointer 2015  and left foot 15-20 years prior had neuropathy sx's  . HEMORRHOID SURGERY    . NASAL SINUS SURGERY     [facility]    Home Medications:  Allergies as of 10/25/2019   No Known Allergies     Medication List       Accurate as of October 25, 2019  9:36 AM. If you have any questions, ask your nurse or doctor.        2-3CC SYRINGE 3 ML Misc Use one syringe every 2 weeks to inject testosterone.   BD Eclipse Needle 21G X 1-1/2" Misc Generic drug: NEEDLE (DISP) 21 G Use one needle to inject testosterone into the muscle.   BD Eclipse Shielded Needle 18G X 1-1/2" Misc Generic drug: NEEDLE (DISP) 18 G Use one needle to draw up medication into the syringe.   esomeprazole 40 MG capsule Commonly known as: NEXIUM Take 40 mg by mouth daily.   MULTI-VITAMIN DAILY PO Take by mouth.   mupirocin ointment 2 % Commonly known as: BACTROBAN Apply 1 application topically daily. With dressing changes   testosterone cypionate 200 MG/ML injection Commonly known as: DEPOTESTOSTERONE CYPIONATE Inject 1 mL (200 mg total) into the muscle every 14 (fourteen) days.   triamcinolone cream 0.1 % Commonly known as: KENALOG APPLY TO AFFECTED AREA OF BITE TWICE A DAY ON LEG UNTIL CLEAR, [place], [place], AXILLA   UNABLE TO FIND Med Name: doterra supplement       Allergies: No Known Allergies  Family History: Family History  Problem Relation Age of Onset  . Cancer Mother        breast  . Hypertension Mother   . Diabetes Mother   . Alcohol abuse Father   . Hyperlipidemia Brother   . Depression Son   . Cancer Brother        kidney cancer    Social History:  reports that he has never smoked. He has quit using smokeless tobacco. He reports that he does not drink alcohol and does not use drugs.   Physical Exam: BP (!) 152/112   Pulse 76   Ht 6' (1.829 m)   Wt 200 lb (90.7 kg)   BMI 27.12 kg/m   Constitutional:  Alert and oriented, No acute distress. HEENT: [place] AT, moist mucus  membranes.  Trachea midline, no masses. Cardiovascular: No clubbing, cyanosis, or edema. Respiratory: Normal respiratory effort, no increased work of breathing. Skin: No rashes, bruises or suspicious lesions. Neurologic: Grossly intact, no focal deficits, moving all 4 extremities. Psychiatric: Normal mood and affect.  Laboratory Data:  Lab Results  Component Value Date   CREATININE 1.00 06/28/2019    Lab Results  Component Value Date   PSA 0.59 06/28/2019   PSA 0.66 03/15/2019   PSA 0.68 03/14/2018    Lab Results  Component Value Date   TESTOSTERONE 698 10/22/2019     Assessment & Plan:    1. Hypogonadism  Mild to moderate improvement on TRT Continue testosterone injections every 2 weeks. F/u in 4 months for PSA/testosterone/hematocrit  2. Erectile dysfunction We discussed TRT does not always improve the ED has the majority of ED problems are related to vascular problems Trial tadalafil RX sent to pharmacy.    [facility] [address], [place], [place] [address] [phone]  I, Selena Batten, am acting as a scribe for Dr. Nicki Reaper C. Joury Allcorn,  I have reviewed the above documentation for accuracy and completeness, and I agree with the above.   Abbie Sons, MD

## 2019-10-25 ENCOUNTER — Other Ambulatory Visit: Payer: Self-pay

## 2019-10-25 ENCOUNTER — Ambulatory Visit: Payer: BC Managed Care – PPO | Admitting: Urology

## 2019-10-25 ENCOUNTER — Encounter: Payer: Self-pay | Admitting: Urology

## 2019-10-25 VITALS — BP 152/112 | HR 76 | Ht 72.0 in | Wt 200.0 lb

## 2019-10-25 DIAGNOSIS — E291 Testicular hypofunction: Secondary | ICD-10-CM | POA: Diagnosis not present

## 2019-10-25 DIAGNOSIS — N5201 Erectile dysfunction due to arterial insufficiency: Secondary | ICD-10-CM | POA: Diagnosis not present

## 2019-10-25 MED ORDER — TADALAFIL 20 MG PO TABS: ORAL_TABLET | ORAL | 0 refills | Status: DC

## 2019-10-28 ENCOUNTER — Telehealth: Payer: Self-pay | Admitting: Internal Medicine

## 2019-10-28 NOTE — Telephone Encounter (Signed)
LVM to set up in office visit for HTN

## 2019-10-28 NOTE — Telephone Encounter (Signed)
Needs appt htn  Multiple outside visit with HTN noted  Call to schedule   Thanks [place]

## 2019-10-29 DIAGNOSIS — M9903 Segmental and somatic dysfunction of lumbar region: Secondary | ICD-10-CM | POA: Diagnosis not present

## 2019-10-29 DIAGNOSIS — M9902 Segmental and somatic dysfunction of thoracic region: Secondary | ICD-10-CM | POA: Diagnosis not present

## 2019-10-29 DIAGNOSIS — M5412 Radiculopathy, cervical region: Secondary | ICD-10-CM | POA: Diagnosis not present

## 2019-10-29 DIAGNOSIS — M9901 Segmental and somatic dysfunction of cervical region: Secondary | ICD-10-CM | POA: Diagnosis not present

## 2019-10-31 ENCOUNTER — Ambulatory Visit: Payer: BC Managed Care – PPO | Admitting: Dermatology

## 2019-11-26 DIAGNOSIS — M9903 Segmental and somatic dysfunction of lumbar region: Secondary | ICD-10-CM | POA: Diagnosis not present

## 2019-11-26 DIAGNOSIS — M5412 Radiculopathy, cervical region: Secondary | ICD-10-CM | POA: Diagnosis not present

## 2019-11-26 DIAGNOSIS — M9901 Segmental and somatic dysfunction of cervical region: Secondary | ICD-10-CM | POA: Diagnosis not present

## 2019-11-26 DIAGNOSIS — M9902 Segmental and somatic dysfunction of thoracic region: Secondary | ICD-10-CM | POA: Diagnosis not present

## 2019-12-12 ENCOUNTER — Other Ambulatory Visit: Payer: Self-pay | Admitting: Urology

## 2019-12-30 ENCOUNTER — Encounter: Payer: Self-pay | Admitting: Dermatology

## 2019-12-30 ENCOUNTER — Ambulatory Visit: Payer: BC Managed Care – PPO | Admitting: Dermatology

## 2019-12-30 DIAGNOSIS — Z1283 Encounter for screening for malignant neoplasm of skin: Secondary | ICD-10-CM | POA: Diagnosis not present

## 2019-12-30 DIAGNOSIS — L820 Inflamed seborrheic keratosis: Secondary | ICD-10-CM

## 2019-12-30 DIAGNOSIS — D229 Melanocytic nevi, unspecified: Secondary | ICD-10-CM

## 2019-12-30 DIAGNOSIS — L821 Other seborrheic keratosis: Secondary | ICD-10-CM

## 2019-12-30 DIAGNOSIS — L570 Actinic keratosis: Secondary | ICD-10-CM | POA: Diagnosis not present

## 2019-12-30 DIAGNOSIS — Z85828 Personal history of other malignant neoplasm of skin: Secondary | ICD-10-CM | POA: Diagnosis not present

## 2019-12-30 DIAGNOSIS — L814 Other melanin hyperpigmentation: Secondary | ICD-10-CM

## 2019-12-30 DIAGNOSIS — D1800 Hemangioma unspecified site: Secondary | ICD-10-CM

## 2019-12-30 DIAGNOSIS — L578 Other skin changes due to chronic exposure to nonionizing radiation: Secondary | ICD-10-CM

## 2019-12-30 NOTE — Patient Instructions (Signed)
Cryotherapy Aftercare  . Wash gently with soap and water everyday.   . Apply Vaseline and Band-Aid daily until healed.  

## 2019-12-30 NOTE — Progress Notes (Signed)
Follow-Up Visit   Subjective  Kevin Ward Kevin Ward is a 54 y.o. male who presents for the following: Annual Exam (Mole check ). Pt c/o irritated itchy spots on his back and R lower leg, he would like removed today.  The patient presents for Total-Body Skin Exam (TBSE) for skin cancer screening and mole check.  The following portions of the chart were reviewed this encounter and updated as appropriate:   Tobacco  Allergies  Meds  Problems  Med Hx  Surg Hx  Fam Hx     Review of Systems:  No other skin or systemic complaints except as noted in HPI or Assessment and Plan.  Objective  Well appearing patient in no apparent distress; mood and affect are within normal limits.  A full examination was performed including scalp, head, eyes, ears, nose, lips, neck, chest, axillae, abdomen, back, buttocks, bilateral upper extremities, bilateral lower extremities, hands, feet, fingers, toes, fingernails, and toenails. All findings within normal limits unless otherwise noted below.  Objective  multiple see history: Well healed scar with no evidence of recurrence.   Objective  L nasal bridge x 1: Erythematous thin papules/macules with gritty scale.   Objective  Mid Back x 2, R lower leg x 1 (3): Erythematous keratotic or waxy stuck-on papule or plaque.    Assessment & Plan  History of basal cell carcinoma (BCC) multiple see history  Clear. Observe for recurrence. Call clinic for new or changing lesions.  Recommend regular skin exams, daily broad-spectrum spf 30+ sunscreen use, and photoprotection.     AK (actinic keratosis) L nasal bridge x 1  Destruction of lesion - L nasal bridge x 1 Complexity: simple   Destruction method: cryotherapy   Informed consent: discussed and consent obtained   Timeout:  patient name, date of birth, surgical site, and procedure verified Lesion destroyed using liquid nitrogen: Yes   Region frozen until ice ball extended beyond lesion: Yes   Outcome:  patient tolerated procedure well with no complications   Post-procedure details: wound care instructions given    Inflamed seborrheic keratosis (3) Mid Back x 2, R lower leg x 1  Destruction of lesion - Mid Back x 2, R lower leg x 1 Complexity: simple   Destruction method: cryotherapy   Informed consent: discussed and consent obtained   Timeout:  patient name, date of birth, surgical site, and procedure verified Lesion destroyed using liquid nitrogen: Yes   Region frozen until ice ball extended beyond lesion: Yes   Outcome: patient tolerated procedure well with no complications   Post-procedure details: wound care instructions given    Skin cancer screening   Lentigines - Scattered tan macules - Discussed due to sun exposure - Benign, observe - Call for any changes  Seborrheic Keratoses - Stuck-on, waxy, tan-brown papules and plaques  - Discussed benign etiology and prognosis. - Observe - Call for any changes  Melanocytic Nevi - Tan-brown and/or pink-flesh-colored symmetric macules and papules - Benign appearing on exam today - Observation - Call clinic for new or changing moles - Recommend daily use of broad spectrum spf 30+ sunscreen to sun-exposed areas.   Hemangiomas - Red papules - Discussed benign nature - Observe - Call for any changes  Actinic Damage - Chronic, secondary to cumulative UV/sun exposure - diffuse scaly erythematous macules with underlying dyspigmentation - Recommend daily broad spectrum sunscreen SPF 30+ to sun-exposed areas, reapply every 2 hours as needed.  - Call for new or changing lesions.  Skin cancer screening  performed today.  Return in about 1 year (around 12/29/2020).  IMarye Round, CMA, am acting as scribe for Sarina Ser, MD .  Documentation: I have reviewed the above documentation for accuracy and completeness, and I agree with the above.  Sarina Ser, MD

## 2019-12-31 DIAGNOSIS — M5412 Radiculopathy, cervical region: Secondary | ICD-10-CM | POA: Diagnosis not present

## 2019-12-31 DIAGNOSIS — M9903 Segmental and somatic dysfunction of lumbar region: Secondary | ICD-10-CM | POA: Diagnosis not present

## 2019-12-31 DIAGNOSIS — M9902 Segmental and somatic dysfunction of thoracic region: Secondary | ICD-10-CM | POA: Diagnosis not present

## 2019-12-31 DIAGNOSIS — M9901 Segmental and somatic dysfunction of cervical region: Secondary | ICD-10-CM | POA: Diagnosis not present

## 2020-01-01 ENCOUNTER — Other Ambulatory Visit: Payer: Self-pay | Admitting: Urology

## 2020-01-01 MED ORDER — TADALAFIL 20 MG PO TABS: ORAL_TABLET | ORAL | 0 refills | Status: DC

## 2020-01-02 ENCOUNTER — Encounter: Payer: Self-pay | Admitting: Dermatology

## 2020-01-28 DIAGNOSIS — M5412 Radiculopathy, cervical region: Secondary | ICD-10-CM | POA: Diagnosis not present

## 2020-01-28 DIAGNOSIS — M9901 Segmental and somatic dysfunction of cervical region: Secondary | ICD-10-CM | POA: Diagnosis not present

## 2020-01-28 DIAGNOSIS — M9903 Segmental and somatic dysfunction of lumbar region: Secondary | ICD-10-CM | POA: Diagnosis not present

## 2020-01-28 DIAGNOSIS — M9902 Segmental and somatic dysfunction of thoracic region: Secondary | ICD-10-CM | POA: Diagnosis not present

## 2020-02-25 ENCOUNTER — Other Ambulatory Visit: Payer: Self-pay

## 2020-02-25 DIAGNOSIS — M5412 Radiculopathy, cervical region: Secondary | ICD-10-CM | POA: Diagnosis not present

## 2020-02-25 DIAGNOSIS — M9903 Segmental and somatic dysfunction of lumbar region: Secondary | ICD-10-CM | POA: Diagnosis not present

## 2020-02-25 DIAGNOSIS — M9901 Segmental and somatic dysfunction of cervical region: Secondary | ICD-10-CM | POA: Diagnosis not present

## 2020-02-25 DIAGNOSIS — M9902 Segmental and somatic dysfunction of thoracic region: Secondary | ICD-10-CM | POA: Diagnosis not present

## 2020-02-28 ENCOUNTER — Ambulatory Visit: Payer: Self-pay | Admitting: Urology

## 2020-03-24 ENCOUNTER — Other Ambulatory Visit: Payer: Self-pay

## 2020-03-24 DIAGNOSIS — M9902 Segmental and somatic dysfunction of thoracic region: Secondary | ICD-10-CM | POA: Diagnosis not present

## 2020-03-24 DIAGNOSIS — M9903 Segmental and somatic dysfunction of lumbar region: Secondary | ICD-10-CM | POA: Diagnosis not present

## 2020-03-24 DIAGNOSIS — M5412 Radiculopathy, cervical region: Secondary | ICD-10-CM | POA: Diagnosis not present

## 2020-03-24 DIAGNOSIS — M9901 Segmental and somatic dysfunction of cervical region: Secondary | ICD-10-CM | POA: Diagnosis not present

## 2020-03-27 ENCOUNTER — Ambulatory Visit: Payer: Self-pay | Admitting: Urology

## 2020-03-31 ENCOUNTER — Other Ambulatory Visit: Payer: Self-pay | Admitting: Internal Medicine

## 2020-03-31 ENCOUNTER — Telehealth: Payer: Self-pay

## 2020-03-31 DIAGNOSIS — E785 Hyperlipidemia, unspecified: Secondary | ICD-10-CM

## 2020-03-31 DIAGNOSIS — Z1389 Encounter for screening for other disorder: Secondary | ICD-10-CM

## 2020-03-31 DIAGNOSIS — Z1329 Encounter for screening for other suspected endocrine disorder: Secondary | ICD-10-CM

## 2020-03-31 DIAGNOSIS — Z125 Encounter for screening for malignant neoplasm of prostate: Secondary | ICD-10-CM

## 2020-03-31 DIAGNOSIS — Z0000 Encounter for general adult medical examination without abnormal findings: Secondary | ICD-10-CM

## 2020-03-31 NOTE — Telephone Encounter (Signed)
Pt would like labs done before CPE on 04/24/20

## 2020-03-31 NOTE — Telephone Encounter (Signed)
Fasting labs not due to 06/27/20  Can we move CPE back to after this ? Cancel appt 04/2020 and move labs to above and move appt back after labs 06/27/20

## 2020-03-31 NOTE — Telephone Encounter (Signed)
What labs would you liked ordered?

## 2020-04-02 ENCOUNTER — Encounter: Payer: BC Managed Care – PPO | Admitting: Internal Medicine

## 2020-04-03 DIAGNOSIS — H524 Presbyopia: Secondary | ICD-10-CM | POA: Diagnosis not present

## 2020-04-03 DIAGNOSIS — H52203 Unspecified astigmatism, bilateral: Secondary | ICD-10-CM | POA: Diagnosis not present

## 2020-04-21 ENCOUNTER — Emergency Department: Payer: BC Managed Care – PPO

## 2020-04-21 ENCOUNTER — Emergency Department
Admission: EM | Admit: 2020-04-21 | Discharge: 2020-04-21 | Disposition: A | Discharge status: Home / Self Care | Payer: BC Managed Care – PPO | Attending: Emergency Medicine | Admitting: Emergency Medicine

## 2020-04-21 ENCOUNTER — Other Ambulatory Visit: Payer: Self-pay

## 2020-04-21 DIAGNOSIS — I700 Atherosclerosis of aorta: Secondary | ICD-10-CM | POA: Diagnosis not present

## 2020-04-21 DIAGNOSIS — N289 Disorder of kidney and ureter, unspecified: Secondary | ICD-10-CM | POA: Diagnosis not present

## 2020-04-21 DIAGNOSIS — R112 Nausea with vomiting, unspecified: Secondary | ICD-10-CM | POA: Diagnosis not present

## 2020-04-21 DIAGNOSIS — R0602 Shortness of breath: Secondary | ICD-10-CM | POA: Diagnosis not present

## 2020-04-21 DIAGNOSIS — R509 Fever, unspecified: Secondary | ICD-10-CM | POA: Insufficient documentation

## 2020-04-21 DIAGNOSIS — Z87891 Personal history of nicotine dependence: Secondary | ICD-10-CM | POA: Insufficient documentation

## 2020-04-21 DIAGNOSIS — Z85828 Personal history of other malignant neoplasm of skin: Secondary | ICD-10-CM | POA: Insufficient documentation

## 2020-04-21 DIAGNOSIS — R109 Unspecified abdominal pain: Secondary | ICD-10-CM | POA: Insufficient documentation

## 2020-04-21 DIAGNOSIS — Z8616 Personal history of COVID-19: Secondary | ICD-10-CM | POA: Diagnosis not present

## 2020-04-21 DIAGNOSIS — R202 Paresthesia of skin: Secondary | ICD-10-CM | POA: Diagnosis not present

## 2020-04-21 DIAGNOSIS — R42 Dizziness and giddiness: Secondary | ICD-10-CM | POA: Diagnosis not present

## 2020-04-21 DIAGNOSIS — Z20822 Contact with and (suspected) exposure to covid-19: Secondary | ICD-10-CM | POA: Diagnosis not present

## 2020-04-21 DIAGNOSIS — K7689 Other specified diseases of liver: Secondary | ICD-10-CM | POA: Diagnosis not present

## 2020-04-21 DIAGNOSIS — R918 Other nonspecific abnormal finding of lung field: Secondary | ICD-10-CM | POA: Diagnosis not present

## 2020-04-21 LAB — COMPREHENSIVE METABOLIC PANEL
ALT: 27 U/L (ref 0–44)
AST: 26 U/L (ref 15–41)
Albumin: 4.8 g/dL (ref 3.5–5.0)
Alkaline Phosphatase: 73 U/L (ref 38–126)
Anion gap: 11 (ref 5–15)
BUN: 17 mg/dL (ref 6–20)
CO2: 24 mmol/L (ref 22–32)
Calcium: 9.6 mg/dL (ref 8.9–10.3)
Chloride: 100 mmol/L (ref 98–111)
Creatinine, Ser: 0.98 mg/dL (ref 0.61–1.24)
GFR, Estimated: >60 mL/min (ref >60)
Glucose, Bld: 103 mg/dL — ABNORMAL HIGH (ref 70–99)
Potassium: 3.6 mmol/L (ref 3.5–5.1)
Sodium: 135 mmol/L (ref 135–145)
Total Bilirubin: 1.1 mg/dL (ref 0.3–1.2)
Total Protein: 7.6 g/dL (ref 6.5–8.1)

## 2020-04-21 LAB — URINALYSIS, COMPLETE (UACMP) WITH MICROSCOPIC
Bacteria, UA: NONE SEEN
Bilirubin Urine: NEGATIVE
Glucose, UA: NEGATIVE mg/dL
Hgb urine dipstick: NEGATIVE
Ketones, ur: 20 mg/dL — AB
Leukocytes,Ua: NEGATIVE
Nitrite: NEGATIVE
Protein, ur: NEGATIVE mg/dL
Specific Gravity, Urine: >1.046 — ABNORMAL HIGH (ref 1.005–1.030)
Squamous Epithelial / HPF: NONE SEEN (ref 0–5)
pH: 7 (ref 5.0–8.0)

## 2020-04-21 LAB — RESP PANEL BY RT-PCR (FLU A&B, COVID) ARPGX2
Influenza A by PCR: NEGATIVE
Influenza B by PCR: NEGATIVE
SARS Coronavirus 2 by RT PCR: NEGATIVE

## 2020-04-21 LAB — CBC
HCT: 47 % (ref 39.0–52.0)
Hemoglobin: 16 g/dL (ref 13.0–17.0)
MCH: 28.7 pg (ref 26.0–34.0)
MCHC: 34 g/dL (ref 30.0–36.0)
MCV: 84.2 fL (ref 80.0–100.0)
Platelets: 205 10*3/uL (ref 150–400)
RBC: 5.58 MIL/uL (ref 4.22–5.81)
RDW: 13.2 % (ref 11.5–15.5)
WBC: 10.4 10*3/uL (ref 4.0–10.5)
nRBC: 0 % (ref 0.0–0.2)

## 2020-04-21 LAB — BRAIN NATRIURETIC PEPTIDE: B Natriuretic Peptide: 4.6 pg/mL (ref 0.0–100.0)

## 2020-04-21 LAB — LACTIC ACID, PLASMA: Lactic Acid, Venous: 1.9 mmol/L (ref 0.5–1.9)

## 2020-04-21 LAB — PROCALCITONIN: Procalcitonin: <0.1 ng/mL

## 2020-04-21 LAB — TROPONIN I (HIGH SENSITIVITY)
Troponin I (High Sensitivity): 6 ng/L (ref <18)
Troponin I (High Sensitivity): 6 ng/L (ref <18)

## 2020-04-21 LAB — LIPASE, BLOOD: Lipase: 36 U/L (ref 11–51)

## 2020-04-21 MED ORDER — ONDANSETRON 4 MG PO TBDP: 4.0000 mg | ORAL_TABLET | Freq: Three times a day (TID) | ORAL | 0 refills | Status: DC | PRN

## 2020-04-21 MED ORDER — ACETAMINOPHEN 500 MG PO TABS
1000.0000 mg | ORAL_TABLET | Freq: Once | ORAL | Status: AC
  Administered 2020-04-21: 1000 mg via ORAL
  Filled 2020-04-21: qty 2

## 2020-04-21 MED ORDER — ONDANSETRON 4 MG PO TBDP: 4.0000 mg | ORAL_TABLET | Freq: Once | ORAL | Status: DC

## 2020-04-21 MED ORDER — HYDROMORPHONE HCL 1 MG/ML IJ SOLN
0.5000 mg | Freq: Once | INTRAMUSCULAR | Status: AC
  Administered 2020-04-21: 0.5 mg via INTRAVENOUS
  Filled 2020-04-21: qty 1

## 2020-04-21 MED ORDER — SODIUM CHLORIDE 0.9 % IV BOLUS
1000.0000 mL | Freq: Once | INTRAVENOUS | Status: AC
  Administered 2020-04-21: 1000 mL via INTRAVENOUS

## 2020-04-21 MED ORDER — ONDANSETRON HCL 4 MG/2ML IJ SOLN
4.0000 mg | Freq: Once | INTRAMUSCULAR | Status: AC
  Administered 2020-04-21: 4 mg via INTRAVENOUS
  Filled 2020-04-21: qty 2

## 2020-04-21 MED ORDER — IOHEXOL 350 MG/ML SOLN
100.0000 mL | Freq: Once | INTRAVENOUS | Status: AC | PRN
  Administered 2020-04-21: 100 mL via INTRAVENOUS

## 2020-04-21 MED ORDER — KETOROLAC TROMETHAMINE 30 MG/ML IJ SOLN
15.0000 mg | Freq: Once | INTRAMUSCULAR | Status: AC
  Administered 2020-04-21: 15 mg via INTRAVENOUS
  Filled 2020-04-21: qty 1

## 2020-04-21 NOTE — ED Provider Notes (Signed)
Is going  [facility] Emergency Department Provider Note  ____________________________________________   Event Date/Time   First MD Initiated Contact with Patient 04/21/20 1631     (approximate)  I have reviewed the triage vital signs and the nursing notes.   HISTORY  Chief Complaint Shortness of Breath and Abdominal Pain    HPI Kevin Ward is a 55 y.o. male otherwise healthy not on any medications who comes in for abdominal pain.  Patient reports having abdominal pain in the center of his abdomen that is severe, constant, nothing makes better, nothing makes it worse.  The pain radiates up into his chest.  He states that because of the pain he has difficulty catching his breath.  He states that he feels like he is going to vomit.  He reports some tingling sensation in his bilateral hands.  Denies hitting his head.  Denies ever having anything like this previously.  Denies anybody else being sick at home          Past Medical History:  Diagnosis Date  . Allergy    declined allergy shots in past used allergy drops not helpful; mold, grasses, dust  . Basal cell carcinoma    right above eyebrow  . Basal cell carcinoma 06/25/2019   left forehead 2.5cm above mid brow  . COVID-19    11/23/18  . Dysplastic nevus 05/14/2012   Right lower back. Mild atypia, deep margin involved.  Marland Kitchen Dysplastic nevus 05/14/2012   Left upper back. Mild atypia. Limited margins free.  Marland Kitchen Dysplastic nevus 06/25/2013   Right inf. lat. buttock. Moderate atypia. Margins involved.  Marland Kitchen Dysplastic nevus 06/25/2013   Right proximal anterior thigh. Mild atypia. Margins close  . Dysplastic nevus 10/05/2015   Post. neck left paraspinal. Mild atypia. Lateral margin involved.  . Hemorrhoids   . Hyperlipidemia   . Stye    right    Patient Active Problem List   Diagnosis Date Noted  . Erectile dysfunction due to arterial insufficiency 10/25/2019  . Hypogonadism in male 07/04/2019   . Low testosterone in male 07/04/2019  . COVID-19 virus detected 11/23/2018  . Annual physical exam 06/15/2018  . Allergic rhinitis 06/15/2018  . Hyperlipidemia 03/14/2018  . Gastroesophageal reflux disease without esophagitis 03/14/2018  . History of basal cell carcinoma 03/14/2018    Past Surgical History:  Procedure Laterality Date  . FOOT SURGERY     [place] neuroma right foot Dr. Milinda Pointer 2015 and left foot 15-20 years prior had neuropathy sx's  . HEMORRHOID SURGERY    . NASAL SINUS SURGERY     [facility]    Prior to Admission medications   Medication Sig Start Date End Date Taking? Authorizing Provider  testosterone cypionate (DEPOTESTOSTERONE CYPIONATE) 200 MG/ML injection INJECT 1 ML (200 MG TOTAL) INTO THE MUSCLE EVERY 14 (FOURTEEN) DAYS. 12/16/19   Stoioff, Ronda Fairly, MD  esomeprazole (NEXIUM) 40 MG capsule Take 40 mg by mouth daily. 07/08/19   [provider]  Multiple Vitamin (MULTI-VITAMIN DAILY PO) Take by mouth.    [provider]  mupirocin ointment (BACTROBAN) 2 % Apply 1 application topically daily. With dressing changes Patient not taking: Reported on 09/17/2019 06/25/19   Ralene Bathe, MD  NEEDLE, DISP, 18 G (BD ECLIPSE SHIELDED NEEDLE) 18G X 1-1/2" MISC Use one needle to draw up medication into the syringe. 09/17/19   Vaillancourt, Samantha, PA-C  NEEDLE, DISP, 21 G (BD ECLIPSE NEEDLE) 21G X 1-1/2" MISC Use one needle to inject  testosterone into the muscle. 09/17/19   Vaillancourt, Aldona Bar, PA-C  Syringe, Disposable, (2-3CC SYRINGE) 3 ML MISC Use one syringe every 2 weeks to inject testosterone. 09/17/19   Debroah Loop, PA-C  tadalafil (CIALIS) 20 MG tablet 1 tab 1 hour prior to intercourse 01/01/20   Stoioff, Scott C, MD  triamcinolone cream (KENALOG) 0.1 % APPLY TO AFFECTED AREA OF BITE TWICE A DAY ON LEG UNTIL CLEAR, AVOID FACE, [place], AXILLA Patient not taking: Reported on 09/17/2019 04/25/18   [provider]  UNABLE TO FIND Med  Name: doterra supplement    [provider]    Allergies Patient has no known allergies.  Family History  Problem Relation Age of Onset  . Cancer Mother        breast  . Hypertension Mother   . Diabetes Mother   . Alcohol abuse Father   . Hyperlipidemia Brother   . Depression Son   . Cancer Brother        kidney cancer    Social History Social History   Tobacco Use  . Smoking status: Never Smoker  . Smokeless tobacco: Former Systems developer  . Tobacco comment: former snuff 15 years ago from 02/2018 occasion.  Substance Use Topics  . Alcohol use: No  . Drug use: No      Review of Systems Constitutional: No fever/chills Eyes: No visual changes. ENT: No sore throat. Cardiovascular: Positive chest pain Respiratory: Positive shortness of breath Gastrointestinal: Positive abdominal pain, Genitourinary: Negative for dysuria. Musculoskeletal: Negative for back pain. Skin: Negative for rash. Neurological: Negative for headaches, focal weakness or numbness. All other ROS negative ____________________________________________   PHYSICAL EXAM:  VITAL SIGNS: ED Triage Vitals  Enc Vitals Group     BP 04/21/20 1539 139/86     Pulse Rate 04/21/20 1539 99     Resp 04/21/20 1539 18     Temp 04/21/20 1539 98 F (36.7 C)     Temp src --      SpO2 04/21/20 1539 100 %     Weight 04/21/20 1539 195 lb (88.5 kg)     Height 04/21/20 1539 6\' 1"  (1.854 m)     Head Circumference --      Peak Flow --      Pain Score 04/21/20 1539 8     Pain Loc --      Pain Edu? --      Excl. in [place]? --     Constitutional: Alert and oriented. Well appearing and in no acute distress. Eyes: Conjunctivae are normal. EOMI. Head: Atraumatic. Nose: No congestion/rhinnorhea. Mouth/Throat: Mucous membranes are moist.   Neck: No stridor. Trachea Midline. FROM Cardiovascular: Tachycardic, regular rhythm. Grossly normal heart sounds.  Good peripheral circulation. Respiratory: Normal respiratory effort.   No retractions. Lungs CTAB. Gastrointestinal: Tenderness in the middle of his abdomen no distention. No abdominal bruits.  Musculoskeletal: No lower extremity tenderness nor edema.  No joint effusions. Neurologic:  Normal speech and language. No gross focal neurologic deficits are appreciated.  Skin:  Skin is warm, dry and intact. No rash noted. Psychiatric: Mood and affect are normal. Speech and behavior are normal. GU: Deferred   ____________________________________________   LABS (all labs ordered are listed, but only abnormal results are displayed)  Labs Reviewed  COMPREHENSIVE METABOLIC PANEL - Abnormal; Notable for the following components:      Result Value   Glucose, Bld 103 (*)    All other components within normal limits  URINALYSIS, COMPLETE (UACMP) WITH MICROSCOPIC -  Abnormal; Notable for the following components:   Color, Urine YELLOW (*)    APPearance CLEAR (*)    Specific Gravity, Urine >1.046 (*)    Ketones, ur 20 (*)    All other components within normal limits  RESP PANEL BY RT-PCR (FLU A&B, COVID) ARPGX2  CULTURE, BLOOD (ROUTINE X 2)  CULTURE, BLOOD (ROUTINE X 2)  LIPASE, BLOOD  CBC  BRAIN NATRIURETIC PEPTIDE  PROCALCITONIN  LACTIC ACID, PLASMA  LACTIC ACID, PLASMA  TROPONIN I (HIGH SENSITIVITY)  TROPONIN I (HIGH SENSITIVITY)  TROPONIN I (HIGH SENSITIVITY)   ____________________________________________   ED ECG REPORT I, Vanessa Seabrook, the attending physician, personally viewed and interpreted this ECG.  Normal sinus rate of 95, T wave inversions in 2 3 V4 through V6, normal intervals ____________________________________________  RADIOLOGY Robert Bellow, personally viewed and evaluated these images (plain radiographs) as part of my medical decision making, as well as reviewing the written report by the radiologist.  ED MD interpretation: No widened mediastinum  Official radiology report(s): DG Chest 1 View  Result Date: 04/21/2020 CLINICAL  DATA:  Shortness of breath and abdominal pain EXAM: CHEST  1 VIEW COMPARISON:  None. FINDINGS: The patient is rotated to the right on today's radiograph, reducing diagnostic sensitivity and specificity. Cardiac and mediastinal margins appear normal. The lungs appear clear. No blunting of the costophrenic angles. IMPRESSION: 1.  No active cardiopulmonary disease is radiographically apparent. Electronically Signed   By: Van Clines M.D.   On: 04/21/2020 16:41   CT Angio Chest/Abd/Pel for Dissection W and/or Wo Contrast  Result Date: 04/21/2020 CLINICAL DATA:  Abdominal pain and shortness of breath. Aortic dissection suspected. Nauseated and dizzy. EXAM: CT ANGIOGRAPHY CHEST, ABDOMEN AND PELVIS TECHNIQUE: Non-contrast CT of the chest was initially obtained. Multidetector CT imaging through the chest, abdomen and pelvis was performed using the standard protocol during bolus administration of intravenous contrast. Multiplanar reconstructed images and MIPs were obtained and reviewed to evaluate the vascular anatomy. CONTRAST:  112mL OMNIPAQUE IOHEXOL 350 MG/ML SOLN COMPARISON:  None. FINDINGS: CTA CHEST FINDINGS Cardiovascular: Preferential opacification of the thoracic aorta. No evidence of thoracic aortic aneurysm or dissection. Normal heart size. No pericardial effusion. Mediastinum/Nodes: No enlarged mediastinal, hilar, or axillary lymph nodes. Thyroid gland, trachea, and esophagus demonstrate no significant findings. Lungs/Pleura: No focal consolidation. Couple of 5 mm pulmonary nodules within the left lower lobe (6:101). Otherwise pulmonary micronodule within the right upper lobe (6: 86). Pulmonary micronodule within the right middle lobe that is subpleural in location (6:73). Several other scattered subpleural micronodules. No pleural effusion or pneumothorax. Musculoskeletal: No chest wall abnormality. Vague sclerotic lesion within the T6 vertebral body (10:116). Otherwise no no suspicious lytic or  blastic osseous lesions. No acute displaced fracture. Multilevel degenerative changes of the spine. Review of the MIP images confirms the above findings. CTA ABDOMEN AND PELVIS FINDINGS VASCULAR Aorta: Mild atherosclerotic plaque. Normal caliber aorta without aneurysm, dissection, vasculitis or significant stenosis. Celiac: Patent without evidence of aneurysm, dissection, vasculitis or significant stenosis. SMA: Patent without evidence of aneurysm, dissection, vasculitis or significant stenosis. Renals: Both renal arteries are patent without evidence of aneurysm, dissection, vasculitis, fibromuscular dysplasia or significant stenosis. IMA: Patent without evidence of aneurysm, dissection, vasculitis or significant stenosis. Inflow: Patent without evidence of aneurysm, dissection, vasculitis or significant stenosis. Veins: No obvious venous abnormality within the limitations of this arterial phase study. Review of the MIP images confirms the above findings. NON-VASCULAR Hepatobiliary: Lobulated fluid density lesions within the right hepatic  lobe likely represent hepatic cysts. Similar finding along the gallbladder within the left liver. Subcentimeter hypodensity within the left hepatic lobe along the false form ligament is too small to characterize. No gallstones, gallbladder wall thickening, or pericholecystic fluid. No biliary dilatation. Pancreas: No focal lesion. Normal pancreatic contour. No surrounding inflammatory changes. No main pancreatic ductal dilatation. Spleen: Normal in size without focal abnormality. Adrenals/Urinary Tract: No adrenal nodule bilaterally. Bilateral kidneys enhance symmetrically. 1.4 cm fluid density lesion within the right kidney likely represents a simple renal cyst. No hydronephrosis. No hydroureter. The urinary bladder is unremarkable. Stomach/Bowel: Stomach is within normal limits. No evidence of bowel wall thickening or dilatation. Appendix appears normal. Lymphatic: No  lymphadenopathy. Reproductive: Prostate is borderline enlarged in size. Other: No intraperitoneal free fluid. No intraperitoneal free gas. No organized fluid collection. Musculoskeletal: No abdominal wall hernia or abnormality. No suspicious lytic or blastic osseous lesions. No acute displaced fracture. Review of the MIP images confirms the above findings. IMPRESSION: 1. No acute vascular abnormality. Aortic Atherosclerosis (ICD10-I70.0) - mild. 2. Couple of 5 mm left lower lobe pulmonary nodules as well as several scattered pulmonary micronodules throughout the lungs. No follow-up needed if patient is low-risk (and has no known or suspected primary neoplasm). Non-contrast chest CT can be considered in 12 months if patient is high-risk. This recommendation follows the consensus statement: Guidelines for Management of Incidental Pulmonary Nodules Detected on CT Images: From the Fleischner Society 2017; Radiology 2017; 284:228-243. 3. Fluid density lesions within the liver likely represent simple hepatic cysts. 4. Otherwise no acute intra-abdominal or intrapelvic abnormality. Electronically Signed   By: Iven Finn M.D.   On: 04/21/2020 17:55    ____________________________________________   PROCEDURES  Procedure(s) performed (including Critical Care):  .1-3 Lead EKG Interpretation Performed by: Vanessa [place], MD Authorized by: Vanessa , MD     Interpretation: abnormal     ECG rate:  100s    ECG rate assessment: tachycardic     Rhythm: sinus tachycardia     Ectopy: none     Conduction: normal       ____________________________________________   INITIAL IMPRESSION / ASSESSMENT AND PLAN / ED COURSE  Kevin Ward was evaluated in Emergency Department on 04/21/2020 for the symptoms described in the history of present illness. He was evaluated in the context of the global COVID-19 pandemic, which necessitated consideration that the patient might be at risk for infection with the  SARS-CoV-2 virus that causes COVID-19. Institutional protocols and algorithms that pertain to the evaluation of patients at risk for COVID-19 are in a state of rapid change based on information released by regulatory bodies including the CDC and federal and state organizations. These policies and algorithms were followed during the patient's care in the ED.    Patient is a 55 year old who comes in with severe abdominal pain radiating up into his chest.  Patient did have an episode of vomiting while I was in the room that was nonbloody nonbilious.  Patient states that he does feel better after that given the extent of his pain will get CT dissection to rule out dissection evaluate for gallbladder pathology versus obstruction versus appendicitis.  Seems less likely to be PE.  Patient's oxygen levels 100% he is got no unilateral leg swelling or really risk factors for pulmonary embolism.  Labs ordered to evaluate Electra abnormalities and AKI.  Patient will be kept on the cardiac monitor and will get EKG and cardiac markers.  We will give patient  some IV Dilaudid, IV fluid and IV Zofran to help with symptoms  EKG does have some T wave inversions but no prior to compare to.  His initial troponin is negative but will need repeat given when pain was started  7:45 PM patient temperature is 101.4 and pulse rate went up to 105.  Patient was given some Tylenol, Toradol and some fluids.  Patient does report having a dental procedure 4  weeks ago.  He is got no tenderness at this time or signs of abscess.  We will add on procalcitonin to evaluate for bacterial infection as well as lactate.  I have no other source for infection right now, his urine was negative chest x-ray was negative, denies any rashes so consider a possible viral infection versus bacteremia.  Procalcitonin was negative and lactate was negative.  I reevaluated patient and he states that he is feeling much better.  We discussed different options  including admitting for observation for blood culture rule out or go home and return to the ER if cultures are positive.  At this time patient is asymptomatic, tolerating p.o. and states that he would prefer to go home.  Given patient's otherwise very healthy I have lower suspicion for bacteremia this could just be a viral illness so patient would like to go home.  Will prescribe Zofran.  We discussed having a low threshold to return to the ER and if his blood cultures are positive then he would obviously need to return to the ER at that time.  Patient expressed understanding and felt comfortable with this plan.   ____________________________________________   FINAL CLINICAL IMPRESSION(S) / ED DIAGNOSES   Final diagnoses:  Fever, unspecified fever cause  Intractable vomiting with nausea, unspecified vomiting type      MEDICATIONS GIVEN DURING THIS VISIT:  Medications  ondansetron (ZOFRAN) injection 4 mg (4 mg Intravenous Given 04/21/20 1652)  HYDROmorphone (DILAUDID) injection 0.5 mg (0.5 mg Intravenous Given 04/21/20 1652)  sodium chloride 0.9 % bolus 1,000 mL (0 mLs Intravenous Stopped 04/21/20 1813)  iohexol (OMNIPAQUE) 350 MG/ML injection 100 mL (100 mLs Intravenous Contrast Given 04/21/20 1723)  sodium chloride 0.9 % bolus 1,000 mL (1,000 mLs Intravenous New Bag/Given 04/21/20 1938)  ketorolac (TORADOL) 30 MG/ML injection 15 mg (15 mg Intravenous Given 04/21/20 1939)  acetaminophen (TYLENOL) tablet 1,000 mg (1,000 mg Oral Given 04/21/20 1939)     ED Discharge Orders         Ordered    ondansetron (ZOFRAN ODT) 4 MG disintegrating tablet  Every 8 hours PRN        04/21/20 2121           Note:  This document was prepared using Dragon voice recognition software and may include unintentional dictation errors.   Vanessa [place], MD 04/21/20 2122

## 2020-04-21 NOTE — ED Triage Notes (Signed)
Pt comes with c/o SOB and severe abdominal pain. Pt states this all just started today. Pt states he has felt nauseated.  Pt states it feels like a knot in the center of his stomach.  Pt states he got lightheaded and dizzy earlier too.  Pt denies any CP

## 2020-04-21 NOTE — ED Notes (Signed)
Last Oral Intake  7am [facility] ; [employer]   2pm Water

## 2020-04-21 NOTE — Discharge Instructions (Signed)
Your CT scan is as below and will need follow-up with the primary care doctor.  I prescribed Zofran to help with nausea and you to take Tylenol to help with fevers.  If your blood cultures are positive you will need to return to the ER or if you develop worsening symptoms have a low threshold to return.  Otherwise this could just be a viral illness and get better on its own in few days    IMPRESSION: 1. No acute vascular abnormality. Aortic Atherosclerosis (ICD10-I70.0) - mild. 2. Couple of 5 mm left lower lobe pulmonary nodules as well as several scattered pulmonary micronodules throughout the lungs. No follow-up needed if patient is low-risk (and has no known or suspected primary neoplasm). Non-contrast chest CT can be considered in 12 months if patient is high-risk. This recommendation follows the consensus statement: Guidelines for Management of Incidental Pulmonary Nodules Detected on CT Images: From the Fleischner Society 2017; Radiology 2017; 284:228-243. 3. Fluid density lesions within the liver likely represent simple hepatic cysts. 4. Otherwise no acute intra-abdominal or intrapelvic abnormality.

## 2020-04-21 NOTE — ED Notes (Signed)
Wife at bedside.

## 2020-04-21 NOTE — ED Notes (Signed)
Discharge instructions reviewed with pt and spouse. Pt calm , collective , denied pain or sob. Ambulatory upon discharge

## 2020-04-23 ENCOUNTER — Encounter: Payer: Self-pay | Admitting: Internal Medicine

## 2020-04-24 ENCOUNTER — Encounter: Payer: BC Managed Care – PPO | Admitting: Internal Medicine

## 2020-04-26 LAB — CULTURE, BLOOD (ROUTINE X 2)
Culture: NO GROWTH
Culture: NO GROWTH
Special Requests: ADEQUATE
Special Requests: ADEQUATE

## 2020-05-05 ENCOUNTER — Other Ambulatory Visit: Payer: Self-pay

## 2020-05-05 ENCOUNTER — Ambulatory Visit (INDEPENDENT_AMBULATORY_CARE_PROVIDER_SITE_OTHER): Payer: BC Managed Care – PPO

## 2020-05-05 ENCOUNTER — Encounter: Payer: Self-pay | Admitting: Internal Medicine

## 2020-05-05 ENCOUNTER — Ambulatory Visit: Payer: BC Managed Care – PPO | Admitting: Internal Medicine

## 2020-05-05 VITALS — BP 118/76 | HR 84 | Temp 98.1°F | Ht 73.0 in | Wt 199.6 lb

## 2020-05-05 DIAGNOSIS — K005 Hereditary disturbances in tooth structure, not elsewhere classified: Secondary | ICD-10-CM

## 2020-05-05 DIAGNOSIS — Q782 Osteopetrosis: Secondary | ICD-10-CM | POA: Diagnosis not present

## 2020-05-05 DIAGNOSIS — M47814 Spondylosis without myelopathy or radiculopathy, thoracic region: Secondary | ICD-10-CM | POA: Diagnosis not present

## 2020-05-05 DIAGNOSIS — R937 Abnormal findings on diagnostic imaging of other parts of musculoskeletal system: Secondary | ICD-10-CM

## 2020-05-05 DIAGNOSIS — M5020 Other cervical disc displacement, unspecified cervical region: Secondary | ICD-10-CM

## 2020-05-05 DIAGNOSIS — K7689 Other specified diseases of liver: Secondary | ICD-10-CM | POA: Diagnosis not present

## 2020-05-05 DIAGNOSIS — R918 Other nonspecific abnormal finding of lung field: Secondary | ICD-10-CM

## 2020-05-05 DIAGNOSIS — I700 Atherosclerosis of aorta: Secondary | ICD-10-CM

## 2020-05-05 DIAGNOSIS — M4802 Spinal stenosis, cervical region: Secondary | ICD-10-CM

## 2020-05-05 DIAGNOSIS — M899 Disorder of bone, unspecified: Secondary | ICD-10-CM

## 2020-05-05 NOTE — Patient Instructions (Addendum)
Consider pfizer covid vaccine and shingrix vaccine  Read about liver cysts web MD or [facility]   Zoster Vaccine, Recombinant injection What is this medicine? ZOSTER VACCINE (ZOS ter vak SEEN) is a vaccine used to reduce the risk of getting shingles. This vaccine is not used to treat shingles or nerve pain from shingles. This medicine may be used for other purposes; ask your health care provider or pharmacist if you have questions. COMMON BRAND NAME(S): [facility] What should I tell my health care provider before I take this medicine? They need to know if you have any of these conditions:  cancer  immune system problems  an unusual or allergic reaction to Zoster vaccine, other medications, foods, dyes, or preservatives  pregnant or trying to get pregnant  breast-feeding How should I use this medicine? This vaccine is injected into a muscle. It is given by a health care provider. A copy of Vaccine Information Statements will be given before each vaccination. Be sure to read this information carefully each time. This sheet may change often. Talk to your health care provider about the use of this vaccine in children. This vaccine is not approved for use in children. Overdosage: If you think you have taken too much of this medicine contact a poison control center or emergency room at once. NOTE: This medicine is only for you. Do not share this medicine with others. What if I miss a dose? Keep appointments for follow-up (booster) doses. It is important not to miss your dose. Call your health care provider if you are unable to keep an appointment. What may interact with this medicine?  medicines that suppress your immune system  medicines to treat cancer  steroid medicines like prednisone or cortisone This list may not describe all possible interactions. Give your health care provider a list of all the medicines, herbs, non-prescription drugs, or dietary supplements you use. Also tell  them if you smoke, drink alcohol, or use illegal drugs. Some items may interact with your medicine. What should I watch for while using this medicine? Visit your health care provider regularly. This vaccine, like all vaccines, may not fully protect everyone. What side effects may I notice from receiving this medicine? Side effects that you should report to your doctor or health care professional as soon as possible:  allergic reactions (skin rash, itching or hives; swelling of the face, lips, or tongue)  trouble breathing Side effects that usually do not require medical attention (report these to your doctor or health care professional if they continue or are bothersome):  chills  headache  fever  nausea  pain, redness, or irritation at site where injected  tiredness  vomiting This list may not describe all possible side effects. Call your doctor for medical advice about side effects. You may report side effects to FDA at 1-800-FDA-1088. Where should I keep my medicine? This vaccine is only given by a health care provider. It will not be stored at home. NOTE: This sheet is a summary. It may not cover all possible information. If you have questions about this medicine, talk to your doctor, pharmacist, or health care provider.  2021 Elsevier/Gold Standard (2019-02-08 16:23:07)  Cholesterol Content in Foods Cholesterol is a waxy, fat-like substance that helps to carry fat in the blood. The body needs cholesterol in small amounts, but too much cholesterol can cause damage to the arteries and heart. Most people should eat less than 200 milligrams (mg) of cholesterol a day. Foods with cholesterol Cholesterol  is found in animal-based foods, such as meat, seafood, and dairy. Generally, low-fat dairy and lean meats have less cholesterol than full-fat dairy and fatty meats. The milligrams of cholesterol per serving (mg per serving) of common cholesterol-containing foods are listed  below. Meat and other proteins  Egg -- one large whole egg has 186 mg.  Veal shank -- 4 oz has 141 mg.  Lean ground [place] (93% lean) -- 4 oz has 118 mg.  Fat-trimmed lamb loin -- 4 oz has 106 mg.  Lean ground beef (90% lean) -- 4 oz has 100 mg.  Lobster -- 3.5 oz has 90 mg.  Pork loin chops -- 4 oz has 86 mg.  Canned salmon -- 3.5 oz has 83 mg.  Fat-trimmed beef top loin -- 4 oz has 78 mg.  Frankfurter -- 1 frank (3.5 oz) has 77 mg.  Crab -- 3.5 oz has 71 mg.  Roasted chicken without skin, white meat -- 4 oz has 66 mg.  Light bologna -- 2 oz has 45 mg.  Deli-cut [place] -- 2 oz has 31 mg.  Canned tuna -- 3.5 oz has 31 mg.  Berniece Salines -- 1 oz has 29 mg.  Oysters and mussels (raw) -- 3.5 oz has 25 mg.  Mackerel -- 1 oz has 22 mg.  Trout -- 1 oz has 20 mg.  Pork sausage -- 1 link (1 oz) has 17 mg.  Salmon -- 1 oz has 16 mg.  Tilapia -- 1 oz has 14 mg. Dairy  Soft-serve ice cream --  cup (4 oz) has 103 mg.  Whole-milk yogurt -- 1 cup (8 oz) has 29 mg.  Cheddar cheese -- 1 oz has 28 mg.  American cheese -- 1 oz has 28 mg.  Whole milk -- 1 cup (8 oz) has 23 mg.  2% milk -- 1 cup (8 oz) has 18 mg.  Cream cheese -- 1 tablespoon (Tbsp) has 15 mg.  Cottage cheese --  cup (4 oz) has 14 mg.  Low-fat (1%) milk -- 1 cup (8 oz) has 10 mg.  Sour cream -- 1 Tbsp has 8.5 mg.  Low-fat yogurt -- 1 cup (8 oz) has 8 mg.  Nonfat Greek yogurt -- 1 cup (8 oz) has 7 mg.  Half-and-half cream -- 1 Tbsp has 5 mg. Fats and oils  Cod liver oil -- 1 tablespoon (Tbsp) has 82 mg.  Butter -- 1 Tbsp has 15 mg.  Lard -- 1 Tbsp has 14 mg.  Bacon grease -- 1 Tbsp has 14 mg.  Mayonnaise -- 1 Tbsp has 5-10 mg.  Margarine -- 1 Tbsp has 3-10 mg. Exact amounts of cholesterol in these foods may vary depending on specific ingredients and brands.   Foods without cholesterol Most plant-based foods do not have cholesterol unless you combine them with a food that has cholesterol.  Foods without cholesterol include:  Grains and cereals.  Vegetables.  Fruits.  Vegetable oils, such as olive, canola, and sunflower oil.  Legumes, such as peas, beans, and lentils.  Nuts and seeds.  Egg whites.   Summary  The body needs cholesterol in small amounts, but too much cholesterol can cause damage to the arteries and heart.  Most people should eat less than 200 milligrams (mg) of cholesterol a day. This information is not intended to replace advice given to you by your health care provider. Make sure you discuss any questions you have with your health care provider. Document Revised: 05/27/2019 Document Reviewed: 05/27/2019 Elsevier Patient  Education  2021 [place].    Pulmonary Nodule  A pulmonary nodule is a small, round growth of tissue in the lung. It is sometimes referred to as a shadow or a spot on the lung. Nodules can vary in size, and most measure less than  of an inch (10 mm). Nodules bigger than 1.2 inches (3 cm) are called lung masses. A pulmonary nodule is sometimes found during a routine chest X-ray or while other imaging tests are done to check for other problems. Pulmonary nodules can be either noncancerous (benign) or cancerous (malignant). Most are noncancerous. Smaller nodules in people who do not smoke and who do not have any other risk factors for lung cancer are more likely to be noncancerous. Larger, irregular nodules in people who smoke or who have a strong family history of lung cancer are more likely to be cancerous. What are the causes? This condition may be caused by:  A bacterial, fungal, or viral infection, such as tuberculosis. The infection is usually an old and inactive one.  Cancerous tissue, such as lung cancer or a cancer in another part of the body that has spread to the lung.  A noncancerous mass of tissue.  Inflammation from conditions such as rheumatoid arthritis.  Abnormal blood vessels in the lungs. What are the signs  or symptoms? Usually, there are no symptoms of this condition. If symptoms appear, they are usually related to the underlying cause. For example, if the condition is caused by an infection, you may have a cough or a fever. How is this diagnosed? This condition is usually diagnosed with an X-ray or CT scan. To help determine whether a pulmonary nodule is benign or malignant, your health care provider will:  Take your medical history.  Perform a physical exam.  Order tests. These may include: ? Chest X-rays. ? A CT scan. This test shows smaller pulmonary nodules more clearly and with more detail than an X-ray. ? A positron emission tomography (PET) scan. This test is done to check if a nodule is cancerous. During the test, a small amount of a radioactive substance is injected into the bloodstream. Then a picture is taken. ? Biopsy to evaluate for cancer or confirm a diagnosis. This procedure involves removing a tissue sample from the nodule by inserting a needle through the chest, placing a scope down into the lung, or doing open surgery. ? A skin test called a tuberculin test. This test is done to check if you have been exposed to the germ that causes tuberculosis. ? Blood tests. How is this treated? Treatment for this condition depends on whether the pulmonary nodule is malignant or benign. It also depends on your risk of getting cancer. Noncancerous nodules usually do not need to be treated, but they may need to be monitored with CT scans. If a CT scan shows that the pulmonary nodule got bigger, more tests may be done. Nodules that are found to be cancerous after a biopsy require treatment depending on the type and stage of the cancer. You will need more diagnostic tests, such as CT and PET scans, to determine the stage of the cancer. Treatments for cancer can include:  Surgery.  Radiation therapy.  Chemotherapy.  Immunotherapy. Some nodules need to be removed. If you need a nodule  removed, you may have a procedure called a thoracotomy. During the procedure, your health care provider will make an incision in your chest and remove the part of the lung where the nodule  is located. Follow these instructions at home:  Take over-the-counter and prescription medicines only as told by your health care provider.  Do not use any products that contain nicotine or tobacco. These products include cigarettes, chewing tobacco, and vaping devices, such as e-cigarettes. If you need help quitting, ask your health care provider.  Keep all follow-up visits. This is important. Contact a health care provider if:  You have pain in your chest, back, or shoulder.  You are short of breath or have trouble breathing when you are active.  You develop a cough, or you develop hoarseness for an unexplained reason.  You feel sick or unusually tired.  You do not feel like eating or you lose weight without trying.  You develop chills or night sweats.  You need two or more pillows to sleep on at night.  You have any of these problems: ? A fever and symptoms that suddenly get worse. ? A fever or persistent symptoms for more than 2-3 days. Get help right away if:  You cannot catch your breath.  You have sudden chest pain.  You start making high-pitched whistling sounds when you breathe, most often when you breathe out (you wheeze).  You cannot stop coughing, or you cough up blood or bloody mucus from your lungs (sputum).  You become dizzy or feel like you may faint. These symptoms may represent a serious problem that is an emergency. Do not wait to see if the symptoms will go away. Get medical help right away. Call your local emergency services (911 in the [place] Do not drive yourself to the hospital. Summary  A pulmonary nodule is a small, round growth of tissue in the lung. Most pulmonary nodules are noncancerous.  Common causes of pulmonary nodules include infection, inflammation, and  noncancerous growths.  This condition is usually diagnosed with an X-ray or CT scan.  Treatment for this condition depends on whether the pulmonary nodule is malignant or benign. It also depends on your risk of getting cancer.  If a nodule is found to be cancerous, you will need specific diagnostic tests and treatment options as told by your health care provider. This information is not intended to replace advice given to you by your health care provider. Make sure you discuss any questions you have with your health care provider. Document Revised: 07/24/2019 Document Reviewed: 07/24/2019 Elsevier Patient Education  [place].
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
## 2020-05-11 DIAGNOSIS — K7689 Other specified diseases of liver: Secondary | ICD-10-CM | POA: Insufficient documentation

## 2020-05-11 DIAGNOSIS — R918 Other nonspecific abnormal finding of lung field: Secondary | ICD-10-CM | POA: Insufficient documentation

## 2020-05-11 DIAGNOSIS — I700 Atherosclerosis of aorta: Secondary | ICD-10-CM | POA: Insufficient documentation

## 2020-05-11 NOTE — Addendum Note (Signed)
Addended by: Orland Mustard on: 05/11/2020 02:20 PM   Modules accepted: Orders

## 2020-05-21 ENCOUNTER — Other Ambulatory Visit: Payer: Self-pay

## 2020-05-21 ENCOUNTER — Ambulatory Visit: Admission: RE | Admit: 2020-05-21 | Payer: BC Managed Care – PPO | Source: Ambulatory Visit

## 2020-05-21 ENCOUNTER — Ambulatory Visit
Admission: RE | Admit: 2020-05-21 | Discharge: 2020-05-21 | Disposition: A | Discharge status: Home / Self Care | Payer: BC Managed Care – PPO | Source: Ambulatory Visit | Attending: Internal Medicine | Admitting: Internal Medicine

## 2020-05-21 DIAGNOSIS — Q782 Osteopetrosis: Secondary | ICD-10-CM | POA: Diagnosis not present

## 2020-05-21 DIAGNOSIS — M50223 Other cervical disc displacement at C6-C7 level: Secondary | ICD-10-CM | POA: Diagnosis not present

## 2020-05-21 DIAGNOSIS — M4802 Spinal stenosis, cervical region: Secondary | ICD-10-CM | POA: Diagnosis not present

## 2020-05-21 DIAGNOSIS — R937 Abnormal findings on diagnostic imaging of other parts of musculoskeletal system: Secondary | ICD-10-CM | POA: Diagnosis not present

## 2020-05-21 DIAGNOSIS — N281 Cyst of kidney, acquired: Secondary | ICD-10-CM | POA: Diagnosis not present

## 2020-05-21 DIAGNOSIS — K005 Hereditary disturbances in tooth structure, not elsewhere classified: Secondary | ICD-10-CM | POA: Diagnosis not present

## 2020-05-21 DIAGNOSIS — K769 Liver disease, unspecified: Secondary | ICD-10-CM | POA: Diagnosis not present

## 2020-05-26 ENCOUNTER — Telehealth: Payer: Self-pay | Admitting: Internal Medicine

## 2020-05-26 NOTE — Telephone Encounter (Signed)
PT is calling back to return call from earlier.

## 2020-05-27 NOTE — Telephone Encounter (Signed)
See result note for MRI °

## 2020-06-01 ENCOUNTER — Other Ambulatory Visit: Payer: Self-pay

## 2020-06-01 ENCOUNTER — Encounter: Payer: Self-pay | Admitting: Internal Medicine

## 2020-06-01 ENCOUNTER — Ambulatory Visit: Payer: BC Managed Care – PPO | Admitting: Dermatology

## 2020-06-01 DIAGNOSIS — D1809 Hemangioma of other sites: Secondary | ICD-10-CM | POA: Insufficient documentation

## 2020-06-01 DIAGNOSIS — R937 Abnormal findings on diagnostic imaging of other parts of musculoskeletal system: Secondary | ICD-10-CM

## 2020-06-01 DIAGNOSIS — M5020 Other cervical disc displacement, unspecified cervical region: Secondary | ICD-10-CM | POA: Insufficient documentation

## 2020-06-01 DIAGNOSIS — L578 Other skin changes due to chronic exposure to nonionizing radiation: Secondary | ICD-10-CM | POA: Diagnosis not present

## 2020-06-01 DIAGNOSIS — M47814 Spondylosis without myelopathy or radiculopathy, thoracic region: Secondary | ICD-10-CM | POA: Insufficient documentation

## 2020-06-01 DIAGNOSIS — D2239 Melanocytic nevi of other parts of face: Secondary | ICD-10-CM

## 2020-06-01 DIAGNOSIS — D492 Neoplasm of unspecified behavior of bone, soft tissue, and skin: Secondary | ICD-10-CM | POA: Diagnosis not present

## 2020-06-01 DIAGNOSIS — C44311 Basal cell carcinoma of skin of nose: Secondary | ICD-10-CM | POA: Diagnosis not present

## 2020-06-01 DIAGNOSIS — M4802 Spinal stenosis, cervical region: Secondary | ICD-10-CM | POA: Insufficient documentation

## 2020-06-01 DIAGNOSIS — C4491 Basal cell carcinoma of skin, unspecified: Secondary | ICD-10-CM | POA: Insufficient documentation

## 2020-06-01 DIAGNOSIS — N281 Cyst of kidney, acquired: Secondary | ICD-10-CM | POA: Insufficient documentation

## 2020-06-01 HISTORY — DX: Basal cell carcinoma of skin, unspecified: C44.91

## 2020-06-01 HISTORY — DX: Abnormal findings on diagnostic imaging of other parts of musculoskeletal system: R93.7

## 2020-06-01 NOTE — Addendum Note (Signed)
Addended by: Orland Mustard on: 06/01/2020 01:17 AM   Modules accepted: Orders

## 2020-06-01 NOTE — Patient Instructions (Addendum)

## 2020-06-01 NOTE — Progress Notes (Signed)
   Follow-Up Visit   Subjective  Kevin Ward is a 55 y.o. male who presents for the following: Skin Problem (Check a spot inside the right ear patients PCP recently noticed. ) and lesion (Check a new area on the right nose patient recently noticed ).  The following portions of the chart were reviewed this encounter and updated as appropriate:   Tobacco  Allergies  Meds  Problems  Med Hx  Surg Hx  Fam Hx     Review of Systems:  No other skin or systemic complaints except as noted in HPI or Assessment and Plan.  Objective  Well appearing patient in no apparent distress; mood and affect are within normal limits.  A focused examination was performed including face,scalp . Relevant physical exam findings are noted in the Assessment and Plan.  Objective  Right prox nasal ala inferior: 0.4 cm   Objective  right prox nasal ala superior: 0.4 cm   Objective  superior ear canal: Cystic appearing nodule,     Assessment & Plan  Neoplasm of skin (2) Right prox nasal ala inferior Epidermal / dermal shaving  Lesion diameter (cm):  0.4 Informed consent: discussed and consent obtained   Patient was prepped and draped in usual sterile fashion: area prepped with alcohol. Anesthesia: the lesion was anesthetized in a standard fashion   Anesthetic:  1% lidocaine w/ epinephrine 1-100,000 buffered w/ 8.4% NaHCO3 Instrument used: flexible razor blade   Hemostasis achieved with: pressure, aluminum chloride and electrodesiccation   Outcome: patient tolerated procedure well   Post-procedure details: wound care instructions given   Post-procedure details comment:  Ointment and small bandage applied  Specimen 1 - Surgical pathology Differential Diagnosis: Hemangioma R/O BCC  Check Margins: No 0.4 cm irregular papule   right prox nasal ala superior Epidermal / dermal shaving  Lesion diameter (cm):  0.4 Informed consent: discussed and consent obtained   Timeout: patient name,  date of birth, surgical site, and procedure verified   Procedure prep:  Patient was prepped and draped in usual sterile fashion Prep type:  Isopropyl alcohol Anesthesia: the lesion was anesthetized in a standard fashion   Anesthetic:  1% lidocaine w/ epinephrine 1-100,000 buffered w/ 8.4% NaHCO3 Hemostasis achieved with: pressure, aluminum chloride and electrodesiccation   Outcome: patient tolerated procedure well   Post-procedure details: sterile dressing applied and wound care instructions given   Dressing type: bandage and petrolatum    Specimen 2 - Surgical pathology Differential Diagnosis: R/O Fibrous papule vs nevus vs skin cancer   Check Margins: No 0.4 cm  Neoplasm of ear canal superior ear canal Possible cyst  ear examined with an otoscope in office today   Recommend consult with Otolaryngologist, per pt he will contact Dr Richardson Landry his ear doctor for an consult appointment  Actinic Damage - chronic, secondary to cumulative UV radiation exposure/sun exposure over time - diffuse scaly erythematous macules with underlying dyspigmentation - Recommend daily broad spectrum sunscreen SPF 30+ to sun-exposed areas, reapply every 2 hours as needed.  - Recommend staying in the shade or wearing long sleeves, sun glasses (UVA+UVB protection) and wide brim hats (4-inch brim around the entire circumference of the hat). - Call for new or changing lesions.  Return for as scheduled .  IMarye Ward, CMA, am acting as scribe for Sarina Ser, MD .  Documentation: I have reviewed the above documentation for accuracy and completeness, and I agree with the above.  Sarina Ser, MD

## 2020-06-03 ENCOUNTER — Telehealth: Payer: Self-pay | Admitting: Internal Medicine

## 2020-06-03 NOTE — Telephone Encounter (Signed)
Patient returned referral phone call. 

## 2020-06-03 NOTE — Telephone Encounter (Signed)
lft vm for pt to call ofc to sch NM bone scan whole body. thanks

## 2020-06-05 ENCOUNTER — Telehealth: Payer: Self-pay

## 2020-06-05 NOTE — Telephone Encounter (Signed)
Left message on voicemail to return my call.  

## 2020-06-05 NOTE — Telephone Encounter (Signed)
-----   Message from Ralene Bathe, MD sent at 06/04/2020 10:19 AM EDT ----- Diagnosis 1. Skin , right prox nasal ala inferior BASAL CELL CARCINOMA, SUPERFICIAL AND NODULAR PATTERNS, BASE INVOLVED 2. Skin , right prox nasal ala superior FIBROUS PAPULE  1- Cancer - BCC Schedule for treatment (EDC) 2- benign fibrous papule No further treatment needed.

## 2020-06-08 NOTE — Telephone Encounter (Signed)
-----   Message from David C Kowalski, MD sent at 06/04/2020 10:19 AM EDT ----- Diagnosis 1. Skin , right prox nasal ala inferior BASAL CELL CARCINOMA, SUPERFICIAL AND NODULAR PATTERNS, BASE INVOLVED 2. Skin , right prox nasal ala superior FIBROUS PAPULE  1- Cancer - BCC Schedule for treatment (EDC) 2- benign fibrous papule No further treatment needed. 

## 2020-06-08 NOTE — Telephone Encounter (Signed)
LM on VM please return my call to discuss biopsy results  

## 2020-06-09 ENCOUNTER — Encounter: Payer: Self-pay | Admitting: Dermatology

## 2020-06-11 NOTE — Telephone Encounter (Signed)
Spoke with pt and went over results that he viewed on MyChart. He has no concerns. Scheduled pt for EDC.

## 2020-06-19 ENCOUNTER — Encounter
Admission: RE | Admit: 2020-06-19 | Discharge: 2020-06-19 | Disposition: A | Discharge status: Home / Self Care | Payer: BC Managed Care – PPO | Source: Ambulatory Visit | Attending: Internal Medicine | Admitting: Internal Medicine

## 2020-06-19 ENCOUNTER — Other Ambulatory Visit: Payer: Self-pay

## 2020-06-19 DIAGNOSIS — K005 Hereditary disturbances in tooth structure, not elsewhere classified: Secondary | ICD-10-CM

## 2020-06-19 DIAGNOSIS — M899 Disorder of bone, unspecified: Secondary | ICD-10-CM | POA: Diagnosis not present

## 2020-06-19 DIAGNOSIS — R937 Abnormal findings on diagnostic imaging of other parts of musculoskeletal system: Secondary | ICD-10-CM

## 2020-06-19 DIAGNOSIS — Q782 Osteopetrosis: Secondary | ICD-10-CM | POA: Insufficient documentation

## 2020-06-19 DIAGNOSIS — G35 Multiple sclerosis: Secondary | ICD-10-CM | POA: Diagnosis not present

## 2020-06-19 MED ORDER — TECHNETIUM TC 99M MEDRONATE IV KIT
20.0000 | PACK | Freq: Once | INTRAVENOUS | Status: AC | PRN
  Administered 2020-06-19: 21.49 via INTRAVENOUS

## 2020-06-30 ENCOUNTER — Other Ambulatory Visit: Payer: BC Managed Care – PPO

## 2020-06-30 ENCOUNTER — Other Ambulatory Visit: Payer: Self-pay

## 2020-06-30 ENCOUNTER — Other Ambulatory Visit (INDEPENDENT_AMBULATORY_CARE_PROVIDER_SITE_OTHER): Payer: BC Managed Care – PPO

## 2020-06-30 DIAGNOSIS — R918 Other nonspecific abnormal finding of lung field: Secondary | ICD-10-CM

## 2020-06-30 DIAGNOSIS — Z0000 Encounter for general adult medical examination without abnormal findings: Secondary | ICD-10-CM | POA: Diagnosis not present

## 2020-06-30 DIAGNOSIS — Z125 Encounter for screening for malignant neoplasm of prostate: Secondary | ICD-10-CM | POA: Diagnosis not present

## 2020-06-30 DIAGNOSIS — E785 Hyperlipidemia, unspecified: Secondary | ICD-10-CM

## 2020-06-30 DIAGNOSIS — Q782 Osteopetrosis: Secondary | ICD-10-CM

## 2020-06-30 DIAGNOSIS — M4802 Spinal stenosis, cervical region: Secondary | ICD-10-CM

## 2020-06-30 DIAGNOSIS — R937 Abnormal findings on diagnostic imaging of other parts of musculoskeletal system: Secondary | ICD-10-CM | POA: Diagnosis not present

## 2020-06-30 DIAGNOSIS — Z1329 Encounter for screening for other suspected endocrine disorder: Secondary | ICD-10-CM

## 2020-06-30 DIAGNOSIS — M899 Disorder of bone, unspecified: Secondary | ICD-10-CM | POA: Diagnosis not present

## 2020-06-30 DIAGNOSIS — K005 Hereditary disturbances in tooth structure, not elsewhere classified: Secondary | ICD-10-CM

## 2020-06-30 DIAGNOSIS — M5020 Other cervical disc displacement, unspecified cervical region: Secondary | ICD-10-CM | POA: Diagnosis not present

## 2020-06-30 LAB — LIPID PANEL
Cholesterol: 188 mg/dL (ref 0–200)
HDL: 46.3 mg/dL (ref >39.00)
LDL Cholesterol: 117 mg/dL — ABNORMAL HIGH (ref 0–99)
NonHDL: 141.73
Total CHOL/HDL Ratio: 4
Triglycerides: 124 mg/dL (ref 0.0–149.0)
VLDL: 24.8 mg/dL (ref 0.0–40.0)

## 2020-06-30 LAB — CREATININE, SERUM: Creatinine, Ser: 0.97 mg/dL (ref 0.40–1.50)

## 2020-06-30 LAB — TSH: TSH: 2.28 u[IU]/mL (ref 0.35–4.50)

## 2020-06-30 LAB — PSA: PSA: 0.67 ng/mL (ref 0.10–4.00)

## 2020-07-02 ENCOUNTER — Other Ambulatory Visit: Payer: Self-pay

## 2020-07-02 ENCOUNTER — Ambulatory Visit (INDEPENDENT_AMBULATORY_CARE_PROVIDER_SITE_OTHER): Payer: BC Managed Care – PPO | Admitting: Internal Medicine

## 2020-07-02 ENCOUNTER — Encounter: Payer: Self-pay | Admitting: Internal Medicine

## 2020-07-02 VITALS — BP 116/74 | HR 74 | Temp 98.1°F | Ht 73.0 in | Wt 200.8 lb

## 2020-07-02 DIAGNOSIS — Z720 Tobacco use: Secondary | ICD-10-CM

## 2020-07-02 DIAGNOSIS — K219 Gastro-esophageal reflux disease without esophagitis: Secondary | ICD-10-CM | POA: Diagnosis not present

## 2020-07-02 DIAGNOSIS — R911 Solitary pulmonary nodule: Secondary | ICD-10-CM

## 2020-07-02 DIAGNOSIS — H6191 Disorder of right external ear, unspecified: Secondary | ICD-10-CM | POA: Diagnosis not present

## 2020-07-02 DIAGNOSIS — H9390 Unspecified disorder of ear, unspecified ear: Secondary | ICD-10-CM

## 2020-07-02 DIAGNOSIS — Z0000 Encounter for general adult medical examination without abnormal findings: Secondary | ICD-10-CM

## 2020-07-02 LAB — PROTEIN ELECTROPHORESIS, URINE REFLEX
Albumin ELP, Urine: 100 %
Alpha-1-Globulin, U: 0 %
Alpha-2-Globulin, U: 0 %
Beta Globulin, U: 0 %
Gamma Globulin, U: 0 %
Protein, Ur: 6.7 mg/dL

## 2020-07-02 MED ORDER — ESOMEPRAZOLE MAGNESIUM 40 MG PO CPDR: 40.0000 mg | DELAYED_RELEASE_CAPSULE | Freq: Every day | ORAL | 3 refills | Status: DC

## 2020-07-02 NOTE — Patient Instructions (Addendum)
Consider shingrix vaccine  CT chest due 04/21/21   Food Choices for Gastroesophageal Reflux Disease, Adult When you have gastroesophageal reflux disease (GERD), the foods you eat and your eating habits are very important. Choosing the right foods can help ease the discomfort of GERD. Consider working with a dietitian to help you [employer] choices. What are tips for following this plan? Reading food labels Look for foods that are low in saturated fat. Foods that have less than 5% of daily value (DV) of fat and 0 g of trans fats may help with your symptoms. Cooking Cook foods using methods other than frying. This may include baking, steaming, grilling, or broiling. These are all methods that do not need a lot of fat for cooking. To add flavor, try to use herbs that are low in spice and acidity. Meal planning  Choose healthy foods that are low in fat, such as fruits, vegetables, whole grains, low-fat dairy products, lean meats, fish, and poultry. Eat frequent, small meals instead of three large meals each day. Eat your meals slowly, in a relaxed setting. Avoid bending over or lying down until 2-3 hours after eating. Limit high-fat foods such as fatty meats or fried foods. Limit your intake of fatty foods, such as oils, butter, and shortening. Avoid the following as told by your health care provider: Foods that cause symptoms. These may be different for different people. Keep a food diary to keep track of foods that cause symptoms. Alcohol. Drinking large amounts of liquid with meals. Eating meals during the 2-3 hours before bed.  Lifestyle Maintain a healthy weight. Ask your health care provider what weight is healthy for you. If you need to lose weight, work with your health care provider to do so safely. Exercise for at least 30 minutes on 5 or more days each week, or as told by your health care provider. Avoid wearing clothes that fit tightly around your waist and chest. Do not use  any products that contain nicotine or tobacco. These products include cigarettes, chewing tobacco, and vaping devices, such as e-cigarettes. If you need help quitting, ask your health care provider. Sleep with the head of your bed raised. Use a wedge under the mattress or blocks under the bed frame to raise the head of the bed. Chew sugar-free gum after mealtimes. What foods should I eat?  Eat a healthy, well-balanced diet of fruits, vegetables, whole grains, low-fat dairy products, lean meats, fish, and poultry. Each person is different. Foods that may trigger symptoms in one person may not trigger any symptoms in another person. Work with your health care provider to identify foods that are safe foryou. The items listed above may not be a complete list of recommended foods and beverages. Contact a dietitian for more information. What foods should I avoid? Limiting some of these foods may help manage the symptoms of GERD. Everyone is different. Consult a dietitian or your health care provider to help youidentify the exact foods to avoid, if any. Fruits Any fruits prepared with added fat. Any fruits that cause symptoms. For some people this may include citrus fruits, such as oranges, grapefruit, pineapple,and lemons. Vegetables Deep-fried vegetables. [place] fries. Any vegetables prepared with added fat. Any vegetables that cause symptoms. For some people, this may include tomatoesand tomato products, chili peppers, onions and garlic, and horseradish. Grains Pastries or quick breads with added fat. Meats and other proteins High-fat meats, such as fatty beef or pork, hot dogs, ribs, ham, sausage, salami, and  bacon. Fried meat or protein, including fried fish and friedchicken. Nuts and nut butters, in large amounts. Dairy Whole milk and chocolate milk. Sour cream. Cream. Ice cream. Cream cheese.Milkshakes. Fats and oils Butter. Margarine. Shortening. Ghee. Beverages Coffee and tea, with or without  caffeine. Carbonated beverages. Sodas. Energy drinks. Fruit juice made with acidic fruits, such as orange or grapefruit.Tomato juice. Alcoholic drinks. Sweets and desserts Chocolate and cocoa. Donuts. Seasonings and condiments Pepper. Peppermint and spearmint. Added salt. Any condiments, herbs, or seasonings that cause symptoms. For some people, this may include curry, hotsauce, or vinegar-based salad dressings. The items listed above may not be a complete list of foods and beverages to avoid. Contact a dietitian for more information. Questions to ask your health care provider Diet and lifestyle changes are usually the first steps that are taken to manage symptoms of GERD. If diet and lifestyle changes do not improve your symptoms,talk with your health care provider about taking medicines. Where to find more information [employer]: aboutgerd.org Summary When you have gastroesophageal reflux disease (GERD), food and lifestyle choices may be very helpful in easing the discomfort of GERD. Eat frequent, small meals instead of three large meals each day. Eat your meals slowly, in a relaxed setting. Avoid bending over or lying down until 2-3 hours after eating. Limit high-fat foods such as fatty meats or fried foods. This information is not intended to replace advice given to you by your health care provider. Make sure you discuss any questions you have with your healthcare provider. Document Revised: 07/15/2019 Document Reviewed: 07/15/2019 Elsevier Patient Education  2022 [place]  Thoracic Strain Rehab Ask your health care provider which exercises are safe for you. Do exercises exactly as told by your health care provider and adjust them as directed. It is normal to feel mild stretching, pulling, tightness, or discomfort as you do these exercises. Stop right away if you feel sudden pain or your pain gets worse. Do not begin these exercises until told  by your health care provider. Stretching and range-of-motion exercise This exercise warms up your muscles and joints and improves the movement and flexibility of your back and shoulders. This exercise also helps to relievepain. Chest and spine stretch  Lie down on your back on a firm surface. Roll a towel or a small blanket so it is about 4 inches (10 cm) in diameter. Put the towel lengthwise under the middle of your back so it is under your spine, but not under your shoulder blades. Put your hands behind your head and let your elbows fall to your sides. This will increase your stretch. Take a deep breath (inhale). Hold for __________ seconds. Relax after you breathe out (exhale). Repeat __________ times. Complete this exercise __________ times a day. Strengthening exercises These exercises build strength and endurance in your back and your shoulder blade muscles. Endurance is the ability to use your muscles for a long time,even after they get tired. Alternating arm and leg raises  Get on your hands and knees on a firm surface. If you are on a hard floor, you may want to use padding, such as an exercise mat, to cushion your knees. Line up your arms and legs. Your hands should be directly below your shoulders, and your knees should be directly below your hips. Lift your left leg behind you. At the same time, raise your right arm and straighten it in front of you. Do not lift your leg higher than your hip.  Do not lift your arm higher than your shoulder. Keep your abdominal and back muscles tight. Keep your hips facing the ground. Do not arch your back. Keep your balance carefully, and do not hold your breath. Hold for __________ seconds. Slowly return to the starting position and repeat with your right leg and your left arm. Repeat __________ times. Complete this exercise __________ times a day. Straight arm rows This exercise is also called shoulder extension exercise. Stand with your  feet shoulder width apart. Secure an exercise band to a stable object in front of you so the band is at or above shoulder height. Hold one end of the exercise band in each hand. Straighten your elbows and lift your hands up to shoulder height. Step back, away from the secured end of the exercise band, until the band stretches. Squeeze your shoulder blades together and pull your hands down to the sides of your thighs. Stop when your hands are straight down by your sides. This is shoulder extension. Do not let your hands go behind your body. Hold for __________ seconds. Slowly return to the starting position. Repeat __________ times. Complete this exercise __________ times a day. Prone shoulder external rotation Lie on your abdomen on a firm bed so your left / right forearm hangs over the edge of the bed and your upper arm is on the bed, straight out from your body. This is the prone position. Your elbow should be bent. Your palm should be facing your feet. If instructed, hold a __________ weight in your hand. Squeeze your shoulder blade toward the middle of your back. Do not let your shoulder lift toward your ear. Keep your elbow bent in a 90-degree angle (right angle) while you slowly move your forearm up toward the ceiling. Move your forearm up to the height of the bed, toward your head. This is external rotation. Your upper arm should not move. At the top of the movement, your palm should face the floor. Hold for __________ seconds. Slowly return to the starting position and relax your muscles. Repeat __________ times. Complete this exercise __________ times a day. Rowing scapular retraction This is an exercise in which the shoulder blades (scapulae) are pulled toward each other (retraction). Sit in a stable chair without armrests, or stand up. Secure an exercise band to a stable object in front of you so the band is at shoulder height. Hold one end of the exercise band in each hand. Your  palms should face down. Bring your arms out straight in front of you. Step back, away from the secured end of the exercise band, until the band stretches. Pull the band backward. As you do this, bend your elbows and squeeze your shoulder blades together, but avoid letting the rest of your body move. Do not shrug your shoulders upward while you do this. Stop when your elbows are at your sides or slightly behind your body. Hold for __________ seconds. Slowly straighten your arms to return to the starting position. Repeat __________ times. Complete this exercise __________ times a day. Posture and body mechanics Good posture and healthy body mechanics can help to relieve stress in your body's tissues and joints. Body mechanics refers to the movements and positions of your body while you do your daily activities. Posture is part of body mechanics. Good posture means: Your spine is in its natural S-curve position (neutral). Your shoulders are pulled back slightly. Your head is not tipped forward. Follow these guidelines to improve your posture  and body mechanics in youreveryday activities. Standing  When standing, keep your spine neutral and your feet about hip width apart. Keep a slight bend in your knees. Your ears, shoulders, and hips should line up with each other. When you do a task in which you lean forward while standing in one place for a long time, place one foot up on a stable object that is 2-4 inches (5-10 cm) high, such as a footstool. This helps keep your spine neutral.  Sitting  When sitting, keep your spine neutral and keep your feet flat on the floor. Use a footrest, if necessary, and keep your thighs parallel to the floor. Avoid rounding your shoulders, and avoid tilting your head forward. When working at a desk or a computer, keep your desk at a height where your hands are slightly lower than your elbows. Slide your chair under your desk so you are close enough to maintain good  posture. When working at a computer, place your monitor at a height where you are looking straight ahead and you do not have to tilt your head forward or downward to look at the screen.  Resting When lying down and resting, avoid positions that are most painful for you. If you have pain with activities such as sitting, bending, stooping, or squatting (flexion-basedactivities), lie in a position in which your body does not bend very much. For example, avoid curling up on your side with your arms and knees near your chest (fetal position). If you have pain with activities such as standing for a long time or reaching with your arms (extension-basedactivities), lie with your spine in a neutral position and bend your knees slightly. Try the following positions: Lie on your side with a pillow between your knees. Lie on your back with a pillow under your knees.  Lifting  When lifting objects, keep your feet at least shoulder width apart and tighten your abdominal muscles. Bend your knees and hips and keep your spine neutral. It is important to lift using the strength of your legs, not your back. Do not lock your knees straight out. Always ask for help to lift heavy or awkward objects.  This information is not intended to replace advice given to you by your health care provider. Make sure you discuss any questions you have with your healthcare provider. Document Revised: 04/27/2018 Document Reviewed: 02/12/2018 Elsevier Patient Education  2022 [place].  Neck Exercises Ask your health care provider which exercises are safe for you. Do exercises exactly as told by your health care provider and adjust them as directed. It is normal to feel mild stretching, pulling, tightness, or discomfort as you do these exercises. Stop right away if you feel sudden pain or your pain gets worse. Do not begin these exercises until told by your health care provider. Neck exercises can be important for many reasons.  They can improve strength and maintain flexibility in your neck, which will help your upper back and preventneck pain. Stretching exercises Rotation neck stretching  Sit in a chair or stand up. Place your feet flat on the floor, shoulder width apart. Slowly turn your head (rotate) to the right until a slight stretch is felt. Turn it all the way to the right so you can look over your right shoulder. Do not tilt or tip your head. Hold this position for 10-30 seconds. Slowly turn your head (rotate) to the left until a slight stretch is felt. Turn it all the way to the left  so you can look over your left shoulder. Do not tilt or tip your head. Hold this position for 10-30 seconds. Repeat __________ times. Complete this exercise __________ times a day. Neck retraction Sit in a sturdy chair or stand up. Look straight ahead. Do not bend your neck. Use your fingers to push your chin backward (retraction). Do not bend your neck for this movement. Continue to face straight ahead. If you are doing the exercise properly, you will feel a slight sensation in your throat and a stretch at the back of your neck. Hold the stretch for 1-2 seconds. Repeat __________ times. Complete this exercise __________ times a day. Strengthening exercises Neck press Lie on your back on a firm bed or on the floor with a pillow under your head. Use your neck muscles to push your head down on the pillow and straighten your spine. Hold the position as well as you can. Keep your head facing up (in a neutral position) and your chin tucked. Slowly count to 5 while holding this position. Repeat __________ times. Complete this exercise __________ times a day. Isometrics These are exercises in which you strengthen the muscles in your neck while keeping your neck still (isometrics). Sit in a supportive chair and place your hand on your forehead. Keep your head and face facing straight ahead. Do not flex or extend your neck while  doing isometrics. Push forward with your head and neck while pushing back with your hand. Hold for 10 seconds. Do the sequence again, this time putting your hand against the back of your head. Use your head and neck to push backward against the hand pressure. Finally, do the same exercise on either side of your head, pushing sideways against the pressure of your hand. Repeat __________ times. Complete this exercise __________ times a day. Prone head lifts Lie face-down (prone position), resting on your elbows so that your chest and upper back are raised. Start with your head facing downward, near your chest. Position your chin either on or near your chest. Slowly lift your head upward. Lift until you are looking straight ahead. Then continue lifting your head as far back as you can comfortably stretch. Hold your head up for 5 seconds. Then slowly lower it to your starting position. Repeat __________ times. Complete this exercise __________ times a day. Supine head lifts Lie on your back (supine position), bending your knees to point to the ceiling and keeping your feet flat on the floor. Lift your head slowly off the floor, raising your chin toward your chest. Hold for 5 seconds. Repeat __________ times. Complete this exercise __________ times a day. Scapular retraction Stand with your arms at your sides. Look straight ahead. Slowly pull both shoulders (scapulae) backward and downward (retraction) until you feel a stretch between your shoulder blades in your upper back. Hold for 10-30 seconds. Relax and repeat. Repeat __________ times. Complete this exercise __________ times a day. Contact a health care provider if: Your neck pain or discomfort gets much worse when you do an exercise. Your neck pain or discomfort does not improve within 2 hours after you exercise. If you have any of these problems, stop exercising right away. Do not do the exercises again unless your health care provider  says that you can. Get help right away if: You develop sudden, severe neck pain. If this happens, stop exercising right away. Do not do the exercises again unless your health care provider says that you can. This information is not  intended to replace advice given to you by your health care provider. Make sure you discuss any questions you have with your healthcare provider. Document Revised: 11/01/2017 Document Reviewed: 11/01/2017 Elsevier Patient Education  [place].  Back Exercises The following exercises strengthen the muscles that help to support the trunk and back. They also help to keep the lower back flexible. Doing these exercises can help to prevent back pain or lessen existing pain. If you have back pain or discomfort, try doing these exercises 2-3 times each day or as told by your health care provider. As your pain improves, do them once each day, but increase the number of times that you repeat the steps for each exercise (do more repetitions). To prevent the recurrence of back pain, continue to do these exercises once each day or as told by your health care provider. Do exercises exactly as told by your health care provider and adjust them as directed. It is normal to feel mild stretching, pulling, tightness, or discomfort as you do these exercises, but you should stop right away if youfeel sudden pain or your pain gets worse. Exercises Single knee to chest Repeat these steps 3-5 times for each leg: Lie on your back on a firm bed or the floor with your legs extended. Bring one knee to your chest. Your other leg should stay extended and in contact with the floor. Hold your knee in place by grabbing your knee or thigh with both hands and hold. Pull on your knee until you feel a gentle stretch in your lower back or buttocks. Hold the stretch for 10-30 seconds. Slowly release and straighten your leg. Pelvic tilt Repeat these steps 5-10 times: Lie on your back on a firm  bed or the floor with your legs extended. Bend your knees so they are pointing toward the ceiling and your feet are flat on the floor. Tighten your lower abdominal muscles to press your lower back against the floor. This motion will tilt your pelvis so your tailbone points up toward the ceiling instead of pointing to your feet or the floor. With gentle tension and even breathing, hold this position for 5-10 seconds. Cat-cow Repeat these steps until your lower back becomes more flexible: Get into a hands-and-knees position on a firm surface. Keep your hands under your shoulders, and keep your knees under your hips. You may place padding under your knees for comfort. Let your head hang down toward your chest. Contract your abdominal muscles and point your tailbone toward the floor so your lower back becomes rounded like the back of a cat. Hold this position for 5 seconds. Slowly lift your head, let your abdominal muscles relax and point your tailbone up toward the ceiling so your back forms a sagging arch like the back of a cow. Hold this position for 5 seconds.  Press-ups Repeat these steps 5-10 times: Lie on your abdomen (face-down) on the floor. Place your palms near your head, about shoulder-width apart. Keeping your back as relaxed as possible and keeping your hips on the floor, slowly straighten your arms to raise the top half of your body and lift your shoulders. Do not use your back muscles to raise your upper torso. You may adjust the placement of your hands to make yourself more comfortable. Hold this position for 5 seconds while you keep your back relaxed. Slowly return to lying flat on the floor.  Bridges Repeat these steps 10 times: Lie on your back on a firm  surface. Bend your knees so they are pointing toward the ceiling and your feet are flat on the floor. Your arms should be flat at your sides, next to your body. Tighten your buttocks muscles and lift your buttocks off the  floor until your waist is at almost the same height as your knees. You should feel the muscles working in your buttocks and the back of your thighs. If you do not feel these muscles, slide your feet 1-2 inches farther away from your buttocks. Hold this position for 3-5 seconds. Slowly lower your hips to the starting position, and allow your buttocks muscles to relax completely. If this exercise is too easy, try doing it with your arms crossed over yourchest. Abdominal crunches Repeat these steps 5-10 times: Lie on your back on a firm bed or the floor with your legs extended. Bend your knees so they are pointing toward the ceiling and your feet are flat on the floor. Cross your arms over your chest. Tip your chin slightly toward your chest without bending your neck. Tighten your abdominal muscles and slowly raise your trunk (torso) high enough to lift your shoulder blades a tiny bit off the floor. Avoid raising your torso higher than that because it can put too much stress on your low back and does not help to strengthen your abdominal muscles. Slowly return to your starting position. Back lifts Repeat these steps 5-10 times: Lie on your abdomen (face-down) with your arms at your sides, and rest your forehead on the floor. Tighten the muscles in your legs and your buttocks. Slowly lift your chest off the floor while you keep your hips pressed to the floor. Keep the back of your head in line with the curve in your back. Your eyes should be looking at the floor. Hold this position for 3-5 seconds. Slowly return to your starting position. Contact a health care provider if: Your back pain or discomfort gets much worse when you do an exercise. Your worsening back pain or discomfort does not lessen within 2 hours after you exercise. If you have any of these problems, stop doing these exercises right away. Do not do them again unless your health care provider says that you can. Get help right away  if: You develop sudden, severe back pain. If this happens, stop doing the exercises right away. Do not do them again unless your health care provider says that you can. This information is not intended to replace advice given to you by your health care provider. Make sure you discuss any questions you have with your healthcare provider. Document Revised: 05/10/2018 Document Reviewed: 10/05/2017 Elsevier Patient Education  [place].

## 2020-07-02 NOTE — Progress Notes (Signed)
Chief Complaint  Patient presents with   Annual Exam   Annual  1. GERD controlled takes nexium 40 mg qd prn  2. MRI C and T spine pending 08/21/20 reviewed bone scan 06/19/20 no concerning lesions  3. Right inner ear canal lesion saw Dr. Raliegh Ip but he rec ENT also had right nose bcc sch for EDC 4 lung nodules needs repeat CT chest 04/2021 to follow up he is former smoker    Review of Systems  Constitutional:  Negative for weight loss.  HENT:  Negative for hearing loss.   Eyes:  Negative for blurred vision.  Respiratory:  Negative for shortness of breath.   Cardiovascular:  Negative for chest pain.  Gastrointestinal:  Negative for abdominal pain.  Musculoskeletal:  Negative for falls and joint pain.  Skin:  Negative for rash.  Neurological:  Negative for headaches.  Psychiatric/Behavioral:  Negative for depression.   Past Medical History:  Diagnosis Date   Allergy    declined allergy shots in past used allergy drops not helpful; mold, grasses, dust   Basal cell carcinoma    right above eyebrow   Basal cell carcinoma 06/25/2019   left forehead 2.5cm above mid brow   BCC (basal cell carcinoma of skin) 06/01/2020   right prox nasal ala inferior, EDC scheduled 07/06/20   COVID-19    11/23/18   Dysplastic nevus 05/14/2012   Right lower back. Mild atypia, deep margin involved.   Dysplastic nevus 05/14/2012   Left upper back. Mild atypia. Limited margins free.   Dysplastic nevus 06/25/2013   Right inf. lat. buttock. Moderate atypia. Margins involved.   Dysplastic nevus 06/25/2013   Right proximal anterior thigh. Mild atypia. Margins close   Dysplastic nevus 10/05/2015   Post. neck left paraspinal. Mild atypia. Lateral margin involved.   Hemorrhoids    Hyperlipidemia    Scoliosis    f/u [place] chiropractor    Stye    right   Past Surgical History:  Procedure Laterality Date   FOOT SURGERY     [place] neuroma right foot Dr. Milinda Pointer 2015 and left foot 15-20 years prior had neuropathy sx's    HEMORRHOID SURGERY     NASAL SINUS SURGERY     [facility]   Family History  Problem Relation Age of Onset   Cancer Mother        breast   Hypertension Mother    Diabetes Mother    Alcohol abuse Father    Hyperlipidemia Brother    Depression Son    Cancer Brother        kidney cancer   Social History   Socioeconomic History   Marital status: Married    Spouse name: Not on file   Number of children: Not on file   Years of education: Not on file   Highest education level: Not on file  Occupational History   Not on file  Tobacco Use   Smoking status: Never   Smokeless tobacco: Former   Tobacco comments:    former snuff 15 years ago from 02/2018 occasion.  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   tadalafil (CIALIS) 20 MG tablet 1 tab 1 hour prior to intercourse   [DISCONTINUED] esomeprazole (NEXIUM) 40 MG capsule Take 40 mg by mouth daily.   No Known Allergies Recent Results (from the past 2160 hour(s))  Lipase, blood     Status: None   Collection Time: 04/21/20  3:47 PM  Result Value Ref Range   Lipase 36 11 - 51 U/L    Comment: Performed at [facility], [address] [place], [place] [address]  Comprehensive metabolic panel     Status: Abnormal   Collection Time: 04/21/20  3:47 PM  Result Value Ref Range   Sodium 135 135 - 145 mmol/L    Potassium 3.6 3.5 - 5.1 mmol/L   Chloride 100 98 - 111 mmol/L   CO2 24 22 - 32 mmol/L   Glucose, Bld 103 (H) 70 - 99 mg/dL    Comment: Glucose reference range applies only to samples taken after fasting for at least 8 hours.   BUN 17 6 - 20 mg/dL   Creatinine, Ser 0.98 0.61 - 1.24 mg/dL   Calcium 9.6 8.9 - 10.3 mg/dL   Total Protein 7.6 6.5 - 8.1 g/dL   Albumin 4.8 3.5 - 5.0 g/dL   AST 26 15 - 41 U/L   ALT 27 0 - 44 U/L   Alkaline Phosphatase 73 38 - 126 U/L   Total Bilirubin 1.1 0.3 - 1.2 mg/dL   GFR, Estimated >60 >60 mL/min    Comment: (NOTE) Calculated using the CKD-EPI Creatinine Equation (2021)    Anion gap 11 5 - 15    Comment: Performed at [facility], [address] [place], [place] [address]  CBC     Status: None   Collection Time: 04/21/20  3:47 PM  Result Value Ref Range   WBC 10.4 4.0 - 10.5 K/uL   RBC 5.58 4.22 - 5.81 MIL/uL   Hemoglobin 16.0 13.0 - 17.0 g/dL   HCT 47.0 39.0 - 52.0 %   MCV 84.2 80.0 - 100.0 fL   MCH 28.7 26.0 - 34.0 pg   MCHC 34.0 30.0 - 36.0 g/dL   RDW 13.2 11.5 - 15.5 %   Platelets 205 150 - 400 K/uL   nRBC 0.0 0.0 - 0.2 %    Comment: Performed at [facility], [place], [place] [address]  Troponin I (High Sensitivity)     Status: None   Collection Time: 04/21/20  3:47 PM  Result Value Ref Range   Troponin I (High Sensitivity) 6 <18 ng/L    Comment: (NOTE) Elevated high sensitivity troponin I (hsTnI) values and significant  changes across serial measurements may suggest ACS but many other  chronic and acute conditions are known to elevate hsTnI results.  Refer to the "Links" section for chest pain algorithms and additional  guidance. Performed at [facility], [address] [place], [place] [address]   Brain natriuretic peptide     Status: None   Collection Time: 04/21/20  3:47 PM  Result Value Ref Range   B Natriuretic Peptide 4.6 0.0 -  100.0 pg/mL    Comment: Performed at [facility], [address] [place], [place] [address]  Urinalysis, Complete w Microscopic Urine, Clean Catch     Status: Abnormal   Collection Time: 04/21/20  4:56 PM  Result Value Ref Range   Color, Urine YELLOW (A) YELLOW   APPearance CLEAR (A) CLEAR   Specific Gravity, Urine >1.046 (H) 1.005 - 1.030   pH 7.0 5.0 - 8.0   Glucose, UA NEGATIVE NEGATIVE mg/dL   Hgb urine dipstick NEGATIVE NEGATIVE   Bilirubin Urine NEGATIVE NEGATIVE   Ketones, ur 20 (A) NEGATIVE mg/dL   Protein, ur NEGATIVE NEGATIVE mg/dL   Nitrite NEGATIVE NEGATIVE   Leukocytes,Ua NEGATIVE NEGATIVE   RBC / HPF 0-5 0 - 5 RBC/hpf   WBC, UA 0-5 0 - 5 WBC/hpf   Bacteria, UA NONE SEEN NONE SEEN   Squamous Epithelial / LPF NONE SEEN 0 - 5   Mucus PRESENT     Comment: Performed at [facility], [address], [place], [place] [address]  Resp Panel by RT-PCR (Flu A&B, Covid) Nasopharyngeal Swab     Status: None   Collection Time: 04/21/20  4:56 PM   Specimen: Nasopharyngeal Swab; Nasopharyngeal(NP) swabs in vial transport medium  Result Value Ref Range   SARS Coronavirus 2 by RT PCR NEGATIVE NEGATIVE    Comment: (NOTE) SARS-CoV-2 target nucleic acids are NOT DETECTED.  The SARS-CoV-2 RNA is generally detectable in upper respiratory specimens during the acute phase of infection. The lowest concentration of SARS-CoV-2 viral copies this assay can detect is 138 copies/mL. A negative result does not preclude SARS-Cov-2 infection and should not be used as the sole basis for treatment or other patient management decisions. A negative result may occur with  improper specimen collection/handling, submission of specimen other than nasopharyngeal swab, presence of viral mutation(s) within the areas targeted by this assay, and inadequate number of viral copies(<138 copies/mL). A negative result must be combined with clinical observations, patient history, and epidemiological information. The expected result is  Negative.  Fact Sheet for Patients:  EntrepreneurPulse.com.au  Fact Sheet for Healthcare Providers:  IncredibleEmployment.be  This test is no t yet approved or cleared by the [place] FDA and  has been authorized for detection and/or diagnosis of SARS-CoV-2 by FDA under an Emergency Use Authorization (EUA). This EUA will remain  in effect (meaning this test can be used) for the duration of the COVID-19 declaration under Section 564(b)(1) of the Act, 21 U.S.C.section 360bbb-3(b)(1), unless the authorization is terminated  or revoked sooner.       Influenza A by PCR NEGATIVE NEGATIVE   Influenza B by PCR NEGATIVE NEGATIVE    Comment: (NOTE) The Xpert Xpress SARS-CoV-2/FLU/RSV plus assay is intended as an aid in the diagnosis of influenza from Nasopharyngeal swab specimens and should not be used as a sole basis for treatment. Nasal washings and aspirates are unacceptable for Xpert Xpress SARS-CoV-2/FLU/RSV testing.  Fact Sheet for Patients: EntrepreneurPulse.com.au  Fact Sheet for Healthcare Providers: IncredibleEmployment.be  This test is not yet approved or cleared by the [place] FDA and has been authorized for detection and/or diagnosis of SARS-CoV-2 by FDA under an Emergency Use Authorization (EUA). This EUA will remain in effect (meaning this test can be used) for the duration of the COVID-19 declaration under Section 564(b)(1) of the Act, 21 U.S.C. section 360bbb-3(b)(1), unless the authorization is terminated or revoked.  Performed at [facility], [address] [place], [place] [address]  Troponin I (High Sensitivity)     Status: None   Collection Time: 04/21/20  6:30 PM  Result Value Ref Range   Troponin I (High Sensitivity) 6 <18 ng/L    Comment: (NOTE) Elevated high sensitivity troponin I (hsTnI) values and significant  changes across serial measurements may suggest  ACS but many other  chronic and acute conditions are known to elevate hsTnI results.  Refer to the "Links" section for chest pain algorithms and additional  guidance. Performed at [facility], [address] [place], [place] [address]   Procalcitonin - Baseline     Status: None   Collection Time: 04/21/20  6:30 PM  Result Value Ref Range   Procalcitonin <0.10 ng/mL    Comment:        Interpretation: PCT (Procalcitonin) <= 0.5 ng/mL: Systemic infection (sepsis) is not likely. Local bacterial infection is possible. (NOTE)       Sepsis PCT Algorithm           Lower Respiratory Tract                                      Infection PCT Algorithm    ----------------------------     ----------------------------         PCT < 0.25 ng/mL                PCT < 0.10 ng/mL          Strongly encourage             Strongly discourage   discontinuation of antibiotics    initiation of antibiotics    ----------------------------     -----------------------------       PCT 0.25 - 0.50 ng/mL            PCT 0.10 - 0.25 ng/mL               OR       >80% decrease in PCT            Discourage initiation of                                            antibiotics      Encourage discontinuation           of antibiotics    ----------------------------     -----------------------------         PCT >= 0.50 ng/mL              PCT 0.26 - 0.50 ng/mL               AND        <80% decrease in PCT             Encourage initiation of                                             antibiotics       Encourage continuation           of antibiotics    ----------------------------     -----------------------------        PCT >= 0.50 ng/mL  PCT > 0.50 ng/mL               AND         increase in PCT                  Strongly encourage                                      initiation of antibiotics    Strongly encourage escalation           of antibiotics                                      -----------------------------                                           PCT <= 0.25 ng/mL                                                 OR                                        > 80% decrease in PCT                                      Discontinue / Do not initiate                                             antibiotics  Performed at [facility], [address] [place], [place] [address]   Blood culture (routine x 2)     Status: None   Collection Time: 04/21/20  7:49 PM   Specimen: BLOOD  Result Value Ref Range   Specimen Description BLOOD RIGHT ANTECUBITAL    Special Requests      BOTTLES DRAWN AEROBIC AND ANAEROBIC Blood Culture adequate volume   Culture      NO GROWTH 5 DAYS Performed at [facility], [address] [place], [place] [address]    Report Status 04/26/2020 FINAL   Blood culture (routine x 2)     Status: None   Collection Time: 04/21/20  7:49 PM   Specimen: BLOOD  Result Value Ref Range   Specimen Description BLOOD BLOOD RIGHT HAND    Special Requests      BOTTLES DRAWN AEROBIC AND ANAEROBIC Blood Culture adequate volume   Culture      NO GROWTH 5 DAYS Performed at [facility], [address] [place], [place] [address]    Report Status 04/26/2020 FINAL   Lactic acid, plasma     Status: None   Collection Time: 04/21/20  7:49 PM  Result Value Ref Range   Lactic Acid, Venous 1.9 0.5 - 1.9 mmol/L    Comment: Performed at [facility]  [facility], [address] [place], [place] [address]  Creatinine     Status: None   Collection Time: 06/30/20  8:47 AM  Result Value Ref Range   Creatinine, Ser 0.97 0.40 - 1.50 mg/dL  Protein Electrophoresis, Urine Rflx.     Status: None   Collection Time: 06/30/20  8:47 AM  Result Value Ref Range   Protein, Ur 6.7 Not Estab. mg/dL   Albumin ELP, Urine 100.0 %   Alpha-1-Globulin, U 0.0 %   Alpha-2-Globulin, U 0.0 %   Beta Globulin, U 0.0 %   Gamma Globulin, U 0.0 %   M Component, Ur  Not Observed Not Observed %   Please Note: Comment     Comment: Protein electrophoresis scan will follow via computer, mail, or courier delivery.   Protein electrophoresis, serum     Status: None (Preliminary result)   Collection Time: 06/30/20  8:47 AM  Result Value Ref Range   Total Protein 7.0 6.0 - 8.5 g/dL   Albumin ELP WILL FOLLOW    Alpha 1 WILL FOLLOW    Alpha 2 WILL FOLLOW    Beta WILL FOLLOW    Gamma Globulin WILL FOLLOW    M-Spike, % WILL FOLLOW    Globulin, Total WILL FOLLOW    A/G Ratio WILL FOLLOW    Please Note: Comment     Comment: Protein electrophoresis scan will follow via computer, mail, or courier delivery.    Interpretation: WILL FOLLOW   PSA     Status: None   Collection Time: 06/30/20  8:47 AM  Result Value Ref Range   PSA 0.67 0.10 - 4.00 ng/mL    Comment: Test performed using Access Hybritech PSA Assay, a parmagnetic partical, chemiluminecent immunoassay.  TSH     Status: None   Collection Time: 06/30/20  8:47 AM  Result Value Ref Range   TSH 2.28 0.35 - 4.50 uIU/mL  Lipid panel     Status: Abnormal   Collection Time: 06/30/20  8:47 AM  Result Value Ref Range   Cholesterol 188 0 - 200 mg/dL    Comment: ATP III Classification       Desirable:  < 200 mg/dL               Borderline High:  200 - 239 mg/dL          High:  > = 240 mg/dL   Triglycerides 124.0 0.0 - 149.0 mg/dL    Comment: Normal:  <150 mg/dLBorderline High:  150 - 199 mg/dL   HDL 46.30 >39.00 mg/dL   VLDL 24.8 0.0 - 40.0 mg/dL   LDL Cholesterol 117 (H) 0 - 99 mg/dL   Total CHOL/HDL Ratio 4     Comment:                Men          Women1/2 Average Risk     3.4          3.3Average Risk          5.0          4.42X Average Risk          9.6          7.13X Average Risk          15.0          11.0                       NonHDL  141.73     Comment: NOTE:  Non-HDL goal should be 30 mg/dL higher than patient's LDL goal (i.e. LDL goal of < 70 mg/dL, would have non-HDL goal of < 100 mg/dL)    Objective  Body mass index is 26.49 kg/m. Wt Readings from Last 3 Encounters:  07/02/20 200 lb 12.8 oz (91.1 kg)  05/05/20 199 lb 9.6 oz (90.5 kg)  04/21/20 195 lb (88.5 kg)   Temp Readings from Last 3 Encounters:  07/02/20 98.1 F (36.7 C) (Oral)  05/05/20 98.1 F (36.7 C) (Oral)  04/21/20 99.8 F (37.7 C) (Oral)   BP Readings from Last 3 Encounters:  07/02/20 116/74  05/05/20 118/76  04/21/20 133/73   Pulse Readings from Last 3 Encounters:  07/02/20 74  05/05/20 84  04/21/20 100    Physical Exam Vitals and nursing note reviewed.  Constitutional:      Appearance: Normal appearance. He is well-developed, well-groomed and overweight.  HENT:     Head: Normocephalic and atraumatic.     Mouth/Throat:     Mouth: Mucous membranes are moist.     Pharynx: Oropharynx is clear.  Cardiovascular:     Rate and Rhythm: Normal rate and regular rhythm.     Heart sounds: Normal heart sounds. No murmur heard. Pulmonary:     Effort: Pulmonary effort is normal.     Breath sounds: Normal breath sounds.  Abdominal:     General: Abdomen is flat. Bowel sounds are normal.  Skin:    General: Skin is warm and moist.  Neurological:     General: No focal deficit present.     Mental Status: He is alert and oriented to person, place, and time. Mental status is at baseline.     Gait: Gait normal.  Psychiatric:        Attention and Perception: Attention and perception normal.        Mood and Affect: Mood and affect normal.        Speech: Speech normal.        Behavior: Behavior normal. Behavior is cooperative.        Thought Content: Thought content normal.        Cognition and Memory: Cognition and memory normal.        Judgment: Judgment normal.    Assessment  Plan  Annual physical exam Flu shot had 10/2018 Tdap utd covid vaccine ? If get in future disc'ed prev had covid 11/2018  Disc shingrix vaccine previously still thinking about this rec MMR, hep B vx consider in future   Hep C, HIV neg 02/09/15  CT lungs due 04/21/21 to f/u lung nodules he is former smoker shot term   PSA 0.67 06/30/20  consider DRE in the future normal in 2021 Colonoscopy had 03/25/16 normal  Referred [facility] 04/02/19 for GERD w/u and consider EGD seen [facility]/20/19 [facility]   H/o BCC right forehead , right nose Dr. Raliegh Ip [place] [facility]- [facility] right nose 05/2020 EDC appt sch   Eye Dr. Ellin Mayhew 6 months prior to 04/02/19 eyes ok  Dentist Dr. Kerri Perches 04/18/19  [facility]. Jueng[place] seen in the past  rec healthy diet and exercise  Pending MRI C and T spine sch 08/21/20    Inner right ear Ear lesion - Plan: Ambulatory referral to ENT Dr. Kathyrn Sheriff  Gastroesophageal reflux disease without esophagitis - Plan: esomeprazole (NEXIUM) 40 MG capsule qd prn Est [facility] as of 06/20/19  Lung nodule - Plan: CT Chest Wo Contrast,  due 04/21/21 former smoker      Provider: Dr. Olivia Mackie McLean-[facility]

## 2020-07-03 DIAGNOSIS — M9901 Segmental and somatic dysfunction of cervical region: Secondary | ICD-10-CM | POA: Diagnosis not present

## 2020-07-03 DIAGNOSIS — M5412 Radiculopathy, cervical region: Secondary | ICD-10-CM | POA: Diagnosis not present

## 2020-07-03 DIAGNOSIS — M9902 Segmental and somatic dysfunction of thoracic region: Secondary | ICD-10-CM | POA: Diagnosis not present

## 2020-07-03 DIAGNOSIS — M9903 Segmental and somatic dysfunction of lumbar region: Secondary | ICD-10-CM | POA: Diagnosis not present

## 2020-07-03 LAB — PROTEIN ELECTROPHORESIS, SERUM
A/G Ratio: 1.4 (ref 0.7–1.7)
Albumin ELP: 4.1 g/dL (ref 2.9–4.4)
Alpha 1: 0.2 g/dL (ref 0.0–0.4)
Alpha 2: 0.7 g/dL (ref 0.4–1.0)
Beta: 1.1 g/dL (ref 0.7–1.3)
Gamma Globulin: 0.8 g/dL (ref 0.4–1.8)
Globulin, Total: 2.9 g/dL (ref 2.2–3.9)
Total Protein: 7 g/dL (ref 6.0–8.5)

## 2020-07-06 ENCOUNTER — Other Ambulatory Visit: Payer: Self-pay

## 2020-07-06 ENCOUNTER — Encounter: Payer: Self-pay | Admitting: Dermatology

## 2020-07-06 ENCOUNTER — Ambulatory Visit: Payer: BC Managed Care – PPO | Admitting: Dermatology

## 2020-07-06 DIAGNOSIS — L578 Other skin changes due to chronic exposure to nonionizing radiation: Secondary | ICD-10-CM

## 2020-07-06 DIAGNOSIS — C44319 Basal cell carcinoma of skin of other parts of face: Secondary | ICD-10-CM

## 2020-07-06 NOTE — Progress Notes (Signed)
   Follow-Up Visit   Subjective  Kevin Ward is a 55 y.o. male who presents for the following: bx proven BCC (R prox nasal ala inf - patient is here today for [facility] ).  The following portions of the chart were reviewed this encounter and updated as appropriate:   Tobacco  Allergies  Meds  Problems  Med Hx  Surg Hx  Fam Hx      Review of Systems:  No other skin or systemic complaints except as noted in HPI or Assessment and Plan.  Objective  Well appearing patient in no apparent distress; mood and affect are within normal limits.  A focused examination was performed including the face. Relevant physical exam findings are noted in the Assessment and Plan.  R prox nasal ala inf Pink bx site   Assessment & Plan  Basal cell carcinoma (BCC) of skin of other part of face R prox nasal ala inf  Destruction of lesion Complexity: extensive   Destruction method: electrodesiccation and curettage   Informed consent: discussed and consent obtained   Timeout:  patient name, date of birth, surgical site, and procedure verified Procedure prep:  Patient was prepped and draped in usual sterile fashion Prep type:  Isopropyl alcohol Anesthesia: the lesion was anesthetized in a standard fashion   Anesthetic:  1% lidocaine w/ epinephrine 1-100,000 buffered w/ 8.4% NaHCO3 Curettage performed in three different directions: Yes   Electrodesiccation performed over the curetted area: Yes   Final wound size (cm):  0.8 Hemostasis achieved with:  pressure, aluminum chloride and electrodesiccation Outcome: patient tolerated procedure well with no complications   Post-procedure details: sterile dressing applied and wound care instructions given   Dressing type: bandage and petrolatum    Bx proven - patient here today for ED&C   Actinic Damage - chronic, secondary to cumulative UV radiation exposure/sun exposure over time - diffuse scaly erythematous macules with underlying dyspigmentation -  Recommend daily broad spectrum sunscreen SPF 30+ to sun-exposed areas, reapply every 2 hours as needed.  - Recommend staying in the shade or wearing long sleeves, sun glasses (UVA+UVB protection) and wide brim hats (4-inch brim around the entire circumference of the hat). - Call for new or changing lesions.   Return for appointment as scheduled.  Luther Redo, CMA, am acting as scribe for Sarina Ser, MD .  Documentation: I have reviewed the above documentation for accuracy and completeness, and I agree with the above.  Sarina Ser, MD

## 2020-07-06 NOTE — Patient Instructions (Signed)

## 2020-07-07 ENCOUNTER — Other Ambulatory Visit: Payer: Self-pay | Admitting: Internal Medicine

## 2020-07-07 DIAGNOSIS — K219 Gastro-esophageal reflux disease without esophagitis: Secondary | ICD-10-CM

## 2020-07-09 ENCOUNTER — Encounter: Payer: Self-pay | Admitting: Dermatology

## 2020-07-28 DIAGNOSIS — M9903 Segmental and somatic dysfunction of lumbar region: Secondary | ICD-10-CM | POA: Diagnosis not present

## 2020-07-28 DIAGNOSIS — M9902 Segmental and somatic dysfunction of thoracic region: Secondary | ICD-10-CM | POA: Diagnosis not present

## 2020-07-28 DIAGNOSIS — M9901 Segmental and somatic dysfunction of cervical region: Secondary | ICD-10-CM | POA: Diagnosis not present

## 2020-07-28 DIAGNOSIS — M5412 Radiculopathy, cervical region: Secondary | ICD-10-CM | POA: Diagnosis not present

## 2020-07-30 DIAGNOSIS — D2321 Other benign neoplasm of skin of right ear and external auricular canal: Secondary | ICD-10-CM | POA: Diagnosis not present

## 2020-08-21 ENCOUNTER — Ambulatory Visit
Admission: RE | Admit: 2020-08-21 | Discharge: 2020-08-21 | Disposition: A | Discharge status: Home / Self Care | Payer: BC Managed Care – PPO | Source: Ambulatory Visit | Attending: Internal Medicine | Admitting: Internal Medicine

## 2020-08-21 ENCOUNTER — Other Ambulatory Visit: Payer: Self-pay

## 2020-08-21 DIAGNOSIS — M5124 Other intervertebral disc displacement, thoracic region: Secondary | ICD-10-CM | POA: Diagnosis not present

## 2020-08-21 DIAGNOSIS — M4802 Spinal stenosis, cervical region: Secondary | ICD-10-CM | POA: Diagnosis not present

## 2020-08-21 DIAGNOSIS — Q782 Osteopetrosis: Secondary | ICD-10-CM | POA: Diagnosis not present

## 2020-08-21 DIAGNOSIS — M5020 Other cervical disc displacement, unspecified cervical region: Secondary | ICD-10-CM

## 2020-08-21 DIAGNOSIS — M899 Disorder of bone, unspecified: Secondary | ICD-10-CM | POA: Insufficient documentation

## 2020-08-21 DIAGNOSIS — R937 Abnormal findings on diagnostic imaging of other parts of musculoskeletal system: Secondary | ICD-10-CM | POA: Diagnosis not present

## 2020-08-21 DIAGNOSIS — M50323 Other cervical disc degeneration at C6-C7 level: Secondary | ICD-10-CM | POA: Diagnosis not present

## 2020-08-21 DIAGNOSIS — M5031 Other cervical disc degeneration,  high cervical region: Secondary | ICD-10-CM | POA: Diagnosis not present

## 2020-08-21 DIAGNOSIS — M50223 Other cervical disc displacement at C6-C7 level: Secondary | ICD-10-CM | POA: Diagnosis not present

## 2020-08-21 DIAGNOSIS — K005 Hereditary disturbances in tooth structure, not elsewhere classified: Secondary | ICD-10-CM | POA: Diagnosis not present

## 2020-08-21 MED ORDER — GADOBUTROL 1 MMOL/ML IV SOLN
9.0000 mL | Freq: Once | INTRAVENOUS | Status: AC | PRN
  Administered 2020-08-21: 9 mL via INTRAVENOUS

## 2020-08-21 MED ORDER — GADOBUTROL 1 MMOL/ML IV SOLN: 9.0000 mL | Freq: Once | INTRAVENOUS | Status: DC | PRN

## 2020-09-01 DIAGNOSIS — M5412 Radiculopathy, cervical region: Secondary | ICD-10-CM | POA: Diagnosis not present

## 2020-09-01 DIAGNOSIS — M9901 Segmental and somatic dysfunction of cervical region: Secondary | ICD-10-CM | POA: Diagnosis not present

## 2020-09-01 DIAGNOSIS — M9902 Segmental and somatic dysfunction of thoracic region: Secondary | ICD-10-CM | POA: Diagnosis not present

## 2020-09-01 DIAGNOSIS — M9903 Segmental and somatic dysfunction of lumbar region: Secondary | ICD-10-CM | POA: Diagnosis not present

## 2020-09-04 ENCOUNTER — Other Ambulatory Visit: Payer: Self-pay | Admitting: Family

## 2020-09-04 ENCOUNTER — Encounter: Payer: Self-pay | Admitting: Internal Medicine

## 2020-09-04 DIAGNOSIS — R937 Abnormal findings on diagnostic imaging of other parts of musculoskeletal system: Secondary | ICD-10-CM

## 2020-09-24 DIAGNOSIS — M5416 Radiculopathy, lumbar region: Secondary | ICD-10-CM | POA: Diagnosis not present

## 2020-09-24 DIAGNOSIS — M899 Disorder of bone, unspecified: Secondary | ICD-10-CM | POA: Diagnosis not present

## 2020-09-29 DIAGNOSIS — M9902 Segmental and somatic dysfunction of thoracic region: Secondary | ICD-10-CM | POA: Diagnosis not present

## 2020-09-29 DIAGNOSIS — M9901 Segmental and somatic dysfunction of cervical region: Secondary | ICD-10-CM | POA: Diagnosis not present

## 2020-09-29 DIAGNOSIS — M5412 Radiculopathy, cervical region: Secondary | ICD-10-CM | POA: Diagnosis not present

## 2020-09-29 DIAGNOSIS — M9903 Segmental and somatic dysfunction of lumbar region: Secondary | ICD-10-CM | POA: Diagnosis not present

## 2020-10-16 ENCOUNTER — Encounter: Payer: Self-pay | Admitting: Internal Medicine

## 2020-10-27 DIAGNOSIS — M9901 Segmental and somatic dysfunction of cervical region: Secondary | ICD-10-CM | POA: Diagnosis not present

## 2020-10-27 DIAGNOSIS — M9902 Segmental and somatic dysfunction of thoracic region: Secondary | ICD-10-CM | POA: Diagnosis not present

## 2020-10-27 DIAGNOSIS — M9903 Segmental and somatic dysfunction of lumbar region: Secondary | ICD-10-CM | POA: Diagnosis not present

## 2020-10-27 DIAGNOSIS — M5412 Radiculopathy, cervical region: Secondary | ICD-10-CM | POA: Diagnosis not present

## 2020-11-09 ENCOUNTER — Encounter: Payer: Self-pay | Admitting: Podiatry

## 2020-11-09 ENCOUNTER — Ambulatory Visit (INDEPENDENT_AMBULATORY_CARE_PROVIDER_SITE_OTHER): Payer: BC Managed Care – PPO

## 2020-11-09 ENCOUNTER — Other Ambulatory Visit: Payer: Self-pay | Admitting: Podiatry

## 2020-11-09 ENCOUNTER — Ambulatory Visit: Payer: BC Managed Care – PPO | Admitting: Podiatry

## 2020-11-09 ENCOUNTER — Other Ambulatory Visit: Payer: Self-pay

## 2020-11-09 DIAGNOSIS — J329 Chronic sinusitis, unspecified: Secondary | ICD-10-CM | POA: Insufficient documentation

## 2020-11-09 DIAGNOSIS — M722 Plantar fascial fibromatosis: Secondary | ICD-10-CM

## 2020-11-09 DIAGNOSIS — M778 Other enthesopathies, not elsewhere classified: Secondary | ICD-10-CM

## 2020-11-09 MED ORDER — MELOXICAM 15 MG PO TABS
15.0000 mg | ORAL_TABLET | Freq: Every day | ORAL | 3 refills | Status: DC
Start: 2020-11-09 — End: 2021-02-01

## 2020-11-09 MED ORDER — TRIAMCINOLONE ACETONIDE 40 MG/ML IJ SUSP
20.0000 mg | Freq: Once | INTRAMUSCULAR | Status: AC
  Administered 2020-11-09: 20 mg

## 2020-11-09 MED ORDER — METHYLPREDNISOLONE 4 MG PO TBPK: ORAL_TABLET | ORAL | 0 refills | Status: DC

## 2020-11-09 NOTE — Progress Notes (Signed)
Subjective:  Patient ID: Zykeem Bauserman, male    DOB: 01-Sep-1965,  MRN: [id number] HPI Chief Complaint  Patient presents with   Foot Pain    Plantar heel/lateral side/toes right - aching x 6 months+, toes drawing up, tried wife's meloxicam and [employer] inserts-meloxicam had pain completely gone, but ran out.   New Patient (Initial Visit)    Est pt 94    55 y.o. male presents with the above complaint.   ROS: Denies fever chills nausea vomiting muscle aches pains calf pain back pain chest pain shortness of breath  Past Medical History:  Diagnosis Date   Allergy    declined allergy shots in past used allergy drops not helpful; mold, grasses, dust   Basal cell carcinoma    right above eyebrow   Basal cell carcinoma 06/25/2019   left forehead 2.5cm above mid brow   BCC (basal cell carcinoma of skin) 06/01/2020   right prox nasal ala inferior, EDC 07/06/20   COVID-19    11/23/18   Dysplastic nevus 05/14/2012   Right lower back. Mild atypia, deep margin involved.   Dysplastic nevus 05/14/2012   Left upper back. Mild atypia. Limited margins free.   Dysplastic nevus 06/25/2013   Right inf. lat. buttock. Moderate atypia. Margins involved.   Dysplastic nevus 06/25/2013   Right proximal anterior thigh. Mild atypia. Margins close   Dysplastic nevus 10/05/2015   Post. neck left paraspinal. Mild atypia. Lateral margin involved.   Hemorrhoids    Hyperlipidemia    Scoliosis    f/u [facility]    Stye    right   Past Surgical History:  Procedure Laterality Date   FOOT SURGERY     [place]on neuroma right foot Dr. Milinda Pointer 2015 and left foot 15-20 years prior had neuropathy sx's   [facility]    Current Outpatient Medications:    meloxicam (MOBIC) 15 MG tablet, Take 1 tablet (15 mg total) by mouth daily., Disp: 30 tablet, Rfl: 3   methylPREDNISolone (MEDROL DOSEPAK) 4 MG TBPK tablet, 6 day dose pack - take as directed,  Disp: 21 tablet, Rfl: 0   esomeprazole (NEXIUM) 40 MG capsule, TAKE 1 CAPSULE (40 MG TOTAL) BY MOUTH DAILY. 30 MIN BEFORE FOOD *NOT COVERED*, Disp: 90 capsule, Rfl: 3   Multiple Vitamin (MULTI-VITAMIN DAILY PO), Take by mouth., Disp: , Rfl:    tadalafil (CIALIS) 20 MG tablet, 1 tab 1 hour prior to intercourse, Disp: 10 tablet, Rfl: 0  No Known Allergies Review of Systems Objective:  There were no vitals filed for this visit.  General: Well developed, nourished, in no acute distress, alert and oriented x3   Dermatological: Skin is warm, dry and supple bilateral. Nails x 10 are well maintained; remaining integument appears unremarkable at this time. There are no open sores, no preulcerative lesions, no rash or signs of infection present.  Vascular: Dorsalis Pedis artery and Posterior Tibial artery pedal pulses are 2/4 bilateral with immedate capillary fill time. Pedal hair growth present. No varicosities and no lower extremity edema present bilateral.   Neruologic: Grossly intact via light touch bilateral. Vibratory intact via tuning fork bilateral. Protective threshold with Semmes Wienstein monofilament intact to all pedal sites bilateral. Patellar and Achilles deep tendon reflexes 2+ bilateral. No Babinski or clonus noted bilateral.   Musculoskeletal: No gross boney pedal deformities bilateral. No pain, crepitus, or limitation noted with foot and ankle range of  motion bilateral. Muscular strength 5/5 in all groups tested bilateral.  Pain on palpation medial calcaneal tubercle right heel.  Gait: Unassisted, Nonantalgic.    Radiographs:  Radiographs taken today demonstrate soft tissue increase in density piperocaine insertion site no acute findings noted.  Assessment & Plan:   Assessment: Planter fasciitis right  Plan: Discussed etiology pathology and surgical therapies at this point injected his right heel.  Started on Medrol Dosepak to be followed by meloxicam.  We will follow-up with  him in the near future.     Artice Bergerson T. [place], [place]

## 2020-11-09 NOTE — Patient Instructions (Signed)

## 2020-11-27 DIAGNOSIS — M9903 Segmental and somatic dysfunction of lumbar region: Secondary | ICD-10-CM | POA: Diagnosis not present

## 2020-11-27 DIAGNOSIS — M5412 Radiculopathy, cervical region: Secondary | ICD-10-CM | POA: Diagnosis not present

## 2020-11-27 DIAGNOSIS — M9901 Segmental and somatic dysfunction of cervical region: Secondary | ICD-10-CM | POA: Diagnosis not present

## 2020-11-27 DIAGNOSIS — M9902 Segmental and somatic dysfunction of thoracic region: Secondary | ICD-10-CM | POA: Diagnosis not present

## 2020-12-21 ENCOUNTER — Ambulatory Visit: Payer: BC Managed Care – PPO | Admitting: Podiatry

## 2020-12-23 ENCOUNTER — Ambulatory Visit: Payer: BC Managed Care – PPO | Admitting: Podiatry

## 2020-12-29 DIAGNOSIS — M9901 Segmental and somatic dysfunction of cervical region: Secondary | ICD-10-CM | POA: Diagnosis not present

## 2020-12-29 DIAGNOSIS — M5412 Radiculopathy, cervical region: Secondary | ICD-10-CM | POA: Diagnosis not present

## 2020-12-29 DIAGNOSIS — M9903 Segmental and somatic dysfunction of lumbar region: Secondary | ICD-10-CM | POA: Diagnosis not present

## 2020-12-29 DIAGNOSIS — M9902 Segmental and somatic dysfunction of thoracic region: Secondary | ICD-10-CM | POA: Diagnosis not present

## 2020-12-30 ENCOUNTER — Ambulatory Visit: Payer: BC Managed Care – PPO | Admitting: Podiatry

## 2020-12-30 ENCOUNTER — Other Ambulatory Visit: Payer: Self-pay

## 2020-12-30 ENCOUNTER — Encounter: Payer: Self-pay | Admitting: Podiatry

## 2020-12-30 ENCOUNTER — Ambulatory Visit: Payer: BC Managed Care – PPO | Admitting: Dermatology

## 2020-12-30 DIAGNOSIS — M722 Plantar fascial fibromatosis: Secondary | ICD-10-CM

## 2020-12-30 MED ORDER — TRIAMCINOLONE ACETONIDE 40 MG/ML IJ SUSP
20.0000 mg | Freq: Once | INTRAMUSCULAR | Status: AC
  Administered 2020-12-30: 16:00:00 20 mg

## 2020-12-30 NOTE — Progress Notes (Signed)
He presents today for follow-up of his right heel.  States that is definitely a big improvement since last time.  Objective: Vital signs are stable he is alert and oriented x3.  Pulses are palpable.  He has some moderate pain on palpation medial calcaneal tubercle right heel.  Assessment: Planter fasciitis is resolving.  Plan: We injected it today 20 mg Kenalog 5 mg Marcaine point maximal tenderness also provided him with a new plantar fascial brace for the contralateral foot and we will follow-up with him in 1 month if necessary.

## 2021-01-26 DIAGNOSIS — M5412 Radiculopathy, cervical region: Secondary | ICD-10-CM | POA: Diagnosis not present

## 2021-01-26 DIAGNOSIS — M9901 Segmental and somatic dysfunction of cervical region: Secondary | ICD-10-CM | POA: Diagnosis not present

## 2021-01-26 DIAGNOSIS — M9903 Segmental and somatic dysfunction of lumbar region: Secondary | ICD-10-CM | POA: Diagnosis not present

## 2021-01-26 DIAGNOSIS — M9902 Segmental and somatic dysfunction of thoracic region: Secondary | ICD-10-CM | POA: Diagnosis not present

## 2021-01-28 DIAGNOSIS — H6123 Impacted cerumen, bilateral: Secondary | ICD-10-CM | POA: Diagnosis not present

## 2021-01-28 DIAGNOSIS — K219 Gastro-esophageal reflux disease without esophagitis: Secondary | ICD-10-CM | POA: Diagnosis not present

## 2021-01-28 DIAGNOSIS — D2321 Other benign neoplasm of skin of right ear and external auricular canal: Secondary | ICD-10-CM | POA: Diagnosis not present

## 2021-02-01 ENCOUNTER — Ambulatory Visit: Payer: BC Managed Care – PPO | Admitting: Podiatry

## 2021-02-01 ENCOUNTER — Other Ambulatory Visit: Payer: Self-pay

## 2021-02-01 DIAGNOSIS — M778 Other enthesopathies, not elsewhere classified: Secondary | ICD-10-CM | POA: Diagnosis not present

## 2021-02-01 MED ORDER — TRIAMCINOLONE ACETONIDE 40 MG/ML IJ SUSP
20.0000 mg | Freq: Once | INTRAMUSCULAR | Status: AC
  Administered 2021-02-01: 20 mg

## 2021-02-01 MED ORDER — MELOXICAM 15 MG PO TABS: 15.0000 mg | ORAL_TABLET | Freq: Every day | ORAL | 3 refills | Status: DC

## 2021-02-01 NOTE — Progress Notes (Signed)
He presents today for follow-up of his right heel he states that is better but not where I want it.  Objective: Vital signs stable alert oriented x3.  Pulses are palpable.  Pain on palpation medial calcaneal tubercle of the right heel.  Pain on end range of motion and mild swelling about the second metatarsophalangeal joint of the right foot  Assessment: Resolving Planter fasciitis.  Capsulitis at the second metatarsophalangeal joint with pain on end range of motion  Plan: Injected around the second metatarsophalangeal joint today

## 2021-02-23 DIAGNOSIS — M9901 Segmental and somatic dysfunction of cervical region: Secondary | ICD-10-CM | POA: Diagnosis not present

## 2021-02-23 DIAGNOSIS — M9902 Segmental and somatic dysfunction of thoracic region: Secondary | ICD-10-CM | POA: Diagnosis not present

## 2021-02-23 DIAGNOSIS — M5412 Radiculopathy, cervical region: Secondary | ICD-10-CM | POA: Diagnosis not present

## 2021-02-23 DIAGNOSIS — M9903 Segmental and somatic dysfunction of lumbar region: Secondary | ICD-10-CM | POA: Diagnosis not present

## 2021-03-15 ENCOUNTER — Other Ambulatory Visit: Payer: Self-pay

## 2021-03-15 ENCOUNTER — Encounter: Payer: Self-pay | Admitting: Podiatry

## 2021-03-15 ENCOUNTER — Ambulatory Visit: Payer: BC Managed Care – PPO | Admitting: Podiatry

## 2021-03-15 DIAGNOSIS — M778 Other enthesopathies, not elsewhere classified: Secondary | ICD-10-CM | POA: Diagnosis not present

## 2021-03-15 DIAGNOSIS — M722 Plantar fascial fibromatosis: Secondary | ICD-10-CM

## 2021-03-15 MED ORDER — MELOXICAM 15 MG PO TABS: 15.0000 mg | ORAL_TABLET | Freq: Every day | ORAL | 3 refills | Status: DC

## 2021-03-15 NOTE — Progress Notes (Signed)
Kevin Ward presents today for follow-up of his Planter fasciitis and capsulitis he states that that he is feeling much better second third toes draw sometimes but other than that he does quite well.  Objective: Vital signs stable alert oriented x3 there is no erythema edema cellulitis drainage or odor he has no reproducible pain on palpation of the plantar fascia of the heels and he has mild flexible hammertoe deformities that are just starting to develop some early osteoarthritic changes at the level of the PIPJ's.  Assessment: Well-healing Planter fasciitis and capsulitis.  Plan: Follow-up with me on an as-needed basis.

## 2021-03-23 DIAGNOSIS — M9902 Segmental and somatic dysfunction of thoracic region: Secondary | ICD-10-CM | POA: Diagnosis not present

## 2021-03-23 DIAGNOSIS — M9901 Segmental and somatic dysfunction of cervical region: Secondary | ICD-10-CM | POA: Diagnosis not present

## 2021-03-23 DIAGNOSIS — M5412 Radiculopathy, cervical region: Secondary | ICD-10-CM | POA: Diagnosis not present

## 2021-03-23 DIAGNOSIS — M9903 Segmental and somatic dysfunction of lumbar region: Secondary | ICD-10-CM | POA: Diagnosis not present

## 2021-04-05 ENCOUNTER — Ambulatory Visit: Payer: BC Managed Care – PPO | Admitting: Dermatology

## 2021-04-07 DIAGNOSIS — H52223 Regular astigmatism, bilateral: Secondary | ICD-10-CM | POA: Diagnosis not present

## 2021-04-07 DIAGNOSIS — H524 Presbyopia: Secondary | ICD-10-CM | POA: Diagnosis not present

## 2021-04-19 ENCOUNTER — Telehealth: Payer: Self-pay | Admitting: Internal Medicine

## 2021-04-19 NOTE — Telephone Encounter (Signed)
Lft pt vm to call ofc to sch CT chest. thanks 

## 2021-04-20 DIAGNOSIS — M5412 Radiculopathy, cervical region: Secondary | ICD-10-CM | POA: Diagnosis not present

## 2021-04-20 DIAGNOSIS — M9903 Segmental and somatic dysfunction of lumbar region: Secondary | ICD-10-CM | POA: Diagnosis not present

## 2021-04-20 DIAGNOSIS — M9902 Segmental and somatic dysfunction of thoracic region: Secondary | ICD-10-CM | POA: Diagnosis not present

## 2021-04-20 DIAGNOSIS — M9901 Segmental and somatic dysfunction of cervical region: Secondary | ICD-10-CM | POA: Diagnosis not present

## 2021-04-22 NOTE — Telephone Encounter (Signed)
Adrianna from [facility] called stating the pt want the ct scan faxed to different facility. [facility] in [place] fax number [phone]  ?Phone number [phone] ?

## 2021-04-28 NOTE — Telephone Encounter (Signed)
Pt called in stating that he spoke with his health insurance company and they advised him of another facility he can go to that is cheaper.... Pt is requesting a referral to [facility].... Facility phone number is [phone] Fax  number [phone] ?

## 2021-05-07 DIAGNOSIS — J9811 Atelectasis: Secondary | ICD-10-CM | POA: Diagnosis not present

## 2021-05-07 DIAGNOSIS — R918 Other nonspecific abnormal finding of lung field: Secondary | ICD-10-CM | POA: Diagnosis not present

## 2021-05-07 DIAGNOSIS — J984 Other disorders of lung: Secondary | ICD-10-CM | POA: Diagnosis not present

## 2021-05-08 ENCOUNTER — Encounter: Payer: Self-pay | Admitting: Emergency Medicine

## 2021-05-08 ENCOUNTER — Ambulatory Visit
Admission: EM | Admit: 2021-05-08 | Discharge: 2021-05-08 | Disposition: A | Discharge status: Home / Self Care | Payer: BC Managed Care – PPO

## 2021-05-08 DIAGNOSIS — J302 Other seasonal allergic rhinitis: Secondary | ICD-10-CM

## 2021-05-08 DIAGNOSIS — R051 Acute cough: Secondary | ICD-10-CM

## 2021-05-08 DIAGNOSIS — J0100 Acute maxillary sinusitis, unspecified: Secondary | ICD-10-CM

## 2021-05-08 MED ORDER — PROMETHAZINE-DM 6.25-15 MG/5ML PO SYRP: 5.0000 mL | ORAL_SOLUTION | Freq: Four times a day (QID) | ORAL | 0 refills | Status: DC | PRN

## 2021-05-08 MED ORDER — PREDNISONE 10 MG (21) PO TBPK: ORAL_TABLET | Freq: Every day | ORAL | 0 refills | Status: DC

## 2021-05-08 NOTE — ED Triage Notes (Signed)
Patient c/o nonproductive cough and nasal congestion x 10 days.  ? ?Patient denies any SOB.  ? ?Patient endorses increased nasal congestion in the morning with green nasal drainage.  ? ?Patient endorses having difficulty sleeping due to symptoms. Patient endorses coughing " so much at night that I can't sleep".  ? ?History of Seasonal Allergies.  ? ?Patient has taken Mucinex and "cough syrup" with no relief of symptoms.  ?

## 2021-05-08 NOTE — ED Provider Notes (Signed)
?[facility] ? ? ? ?CSN: [id number] ?Arrival date & time: 05/08/21  0805 ? ? ?  ? ?History   ?Chief Complaint ?Chief Complaint  ?Patient presents with  ? Cough  ? Nasal Congestion  ? ? ?HPI ?Kevin Ward is a 56 y.o. male.  Patient presents with 10-day history of sinus congestion and cough.  His cough is productive first thing in the morning but then nonproductive for the remainder of the day and night.  His symptoms have gotten worse over the last 4 days and he is having difficulty sleeping due to his cough.  No fever, rash, sore throat, shortness of breath, or other symptoms.  Treatment attempted with Mucinex and OTC cough syrup.  Patient states his symptoms do not resemble previous sinus infections but feel more like allergies.  His medical history includes recurrent sinusitis, lung nodules, atherosclerosis, hyperlipidemia, GERD, basal cell carcinoma, thoracic spondylosis, cervical herniated disc, spinal stenosis. ? ?The history is provided by the patient and medical records.  ? ?Past Medical History:  ?Diagnosis Date  ? Allergy   ? declined allergy shots in past used allergy drops not helpful; mold, grasses, dust  ? Basal cell carcinoma   ? right above eyebrow  ? Basal cell carcinoma 06/25/2019  ? left forehead 2.5cm above mid brow  ? BCC (basal cell carcinoma of skin) 06/01/2020  ? right prox nasal ala inferior, EDC 07/06/20  ? COVID-19   ? 11/23/18  ? Dysplastic nevus 05/14/2012  ? Right lower back. Mild atypia, deep margin involved.  ? Dysplastic nevus 05/14/2012  ? Left upper back. Mild atypia. Limited margins free.  ? Dysplastic nevus 06/25/2013  ? Right inf. lat. buttock. Moderate atypia. Margins involved.  ? Dysplastic nevus 06/25/2013  ? Right proximal anterior thigh. Mild atypia. Margins close  ? Dysplastic nevus 10/05/2015  ? Post. neck left paraspinal. Mild atypia. Lateral margin involved.  ? Hemorrhoids   ? Hyperlipidemia   ? Scoliosis   ? f/u [facility]ractor   ? Stye   ? right   ? ? ?Patient Active Problem List  ? Diagnosis Date Noted  ? Recurrent sinusitis 11/09/2020  ? Cyst of right kidney 06/01/2020  ? Hemangioma of spine 06/01/2020  ? Thoracic spondylosis 06/01/2020  ? Cervical herniated disc 06/01/2020  ? Spinal stenosis of cervical region 06/01/2020  ? Abnormal MRI, thoracic spine 06/01/2020  ? BCC (basal cell carcinoma of skin) 06/01/2020  ? Liver cyst 05/11/2020  ? Lung nodules 05/11/2020  ? Aortic atherosclerosis ([place]) 05/11/2020  ? Erectile dysfunction due to arterial insufficiency 10/25/2019  ? Hypogonadism in male 07/04/2019  ? Low testosterone in male 07/04/2019  ? COVID-19 virus detected 11/23/2018  ? Annual physical exam 06/15/2018  ? Allergic rhinitis 06/15/2018  ? Hyperlipidemia 03/14/2018  ? Gastroesophageal reflux disease without esophagitis 03/14/2018  ? History of basal cell carcinoma 03/14/2018  ? ? ?Past Surgical History:  ?Procedure Laterality Date  ? FOOT SURGERY    ? [place] neuroma right foot Dr. Milinda Pointer 2015 and left foot 15-20 years prior had neuropathy sx's  ? HEMORRHOID SURGERY    ? NASAL SINUS SURGERY    ? [facility]  ? ? ? ? ? ?Home Medications   ? ?Prior to Admission medications   ?Medication Sig Start Date End Date Taking? Authorizing Provider  ?meloxicam (MOBIC) 15 MG tablet Take 1 tablet (15 mg total) by mouth daily. 03/15/21  Yes Hyatt, Max T, DPM  ?Multiple Vitamin (MULTI-VITAMIN DAILY PO) Take by mouth.  Yes [provider]  ?pantoprazole (PROTONIX) 20 MG tablet Take 20 mg by mouth daily.   Yes [provider]  ?predniSONE (STERAPRED UNI-PAK 21 TAB) 10 MG (21) TBPK tablet Take by mouth daily. As directed 05/08/21  Yes Sharion Balloon, NP  ?promethazine-dextromethorphan (PROMETHAZINE-DM) 6.25-15 MG/5ML syrup Take 5 mLs by mouth 4 (four) times daily as needed for cough. 05/08/21  Yes Sharion Balloon, NP  ?esomeprazole (NEXIUM) 40 MG capsule TAKE 1 CAPSULE (40 MG TOTAL) BY MOUTH DAILY. 30 MIN BEFORE FOOD *NOT COVERED* 07/07/20    McLean-Scocuzza, Nino Glow, MD  ?tadalafil (CIALIS) 20 MG tablet 1 tab 1 hour prior to intercourse 01/01/20   Abbie Sons, MD  ? ? ?Family History ?Family History  ?Problem Relation Age of Onset  ? Cancer Mother   ?     breast  ? Hypertension Mother   ? Diabetes Mother   ? Alcohol abuse Father   ? Hyperlipidemia Brother   ? Depression Son   ? Cancer Brother   ?     kidney cancer  ? ? ?Social History ?Social History  ? ?Tobacco Use  ? Smoking status: Never  ? Smokeless tobacco: Former  ? Tobacco comments:  ?  former snuff 15 years ago from 02/2018 occasion.  ?Substance Use Topics  ? Alcohol use: No  ? Drug use: No  ? ? ? ?Allergies   ?Patient has no known allergies. ? ? ?Review of Systems ?Review of Systems  ?Constitutional:  Negative for chills and fever.  ?HENT:  Positive for congestion. Negative for ear pain, postnasal drip, rhinorrhea, sinus pressure, sinus pain and sore throat.   ?Respiratory:  Positive for cough. Negative for shortness of breath.   ?Cardiovascular:  Negative for chest pain and palpitations.  ?Gastrointestinal:  Negative for diarrhea and vomiting.  ?Skin:  Negative for color change and rash.  ?All other systems reviewed and are negative. ? ? ?Physical Exam ?Triage Vital Signs ?ED Triage Vitals [05/08/21 0813]  ?Enc Vitals Group  ?   BP (!) 143/88  ?   Pulse Rate 72  ?   Resp 18  ?   Temp 98.4 ?F (36.9 ?C)  ?   Temp src   ?   SpO2 96 %  ?   Weight   ?   Height   ?   Head Circumference   ?   Peak Flow   ?   Pain Score   ?   Pain Loc   ?   Pain Edu?   ?   Excl. in [place]?   ? ?No data found. ? ?Updated Vital Signs ?BP (!) 143/88   Pulse 72   Temp 98.4 ?F (36.9 ?C)   Resp 18   SpO2 96%  ? ?Visual Acuity ?Right Eye Distance:   ?Left Eye Distance:   ?Bilateral Distance:   ? ?Right Eye Near:   ?Left Eye Near:    ?Bilateral Near:    ? ?Physical Exam ?Vitals and nursing note reviewed.  ?Constitutional:   ?   General: He is not in acute distress. ?   Appearance: Normal appearance. He is well-developed.  He is not ill-appearing.  ?HENT:  ?   Right Ear: Tympanic membrane normal.  ?   Left Ear: Tympanic membrane normal.  ?   Nose: Nose normal.  ?   Mouth/Throat:  ?   Mouth: Mucous membranes are moist.  ?   Pharynx: Oropharynx is clear.  ?Cardiovascular:  ?  Rate and Rhythm: Normal rate and regular rhythm.  ?   Heart sounds: Normal heart sounds.  ?Pulmonary:  ?   Effort: Pulmonary effort is normal. No respiratory distress.  ?   Breath sounds: Normal breath sounds.  ?Musculoskeletal:  ?   Cervical back: Neck supple.  ?Skin: ?   General: Skin is warm and dry.  ?Neurological:  ?   Mental Status: He is alert.  ?Psychiatric:     ?   Mood and Affect: Mood normal.     ?   Behavior: Behavior normal.  ? ? ? ?UC Treatments / Results  ?Labs ?(all labs ordered are listed, but only abnormal results are displayed) ?Labs Reviewed - No data to display ? ?EKG ? ? ?Radiology ?No results found. ? ?Procedures ?Procedures (including critical care time) ? ?Medications Ordered in UC ?Medications - No data to display ? ?Initial Impression / Assessment and Plan / UC Course  ?I have reviewed the triage vital signs and the nursing notes. ? ?Pertinent labs & imaging results that were available during my care of the patient were reviewed by me and considered in my medical decision making (see chart for details). ? ? Seasonal allergies, cough, sinusitis.  Patient states his symptoms do not feel like previous sinus infections and he does not feel that he needs an antibiotic at this time.  Treating with prednisone taper and Promethazine DM.  Precautions for drowsiness with promethazine DM discussed.  Education provided on allergic rhinitis and sinus infection.  Instructed patient to follow-up with his PCP if his symptoms are not improving.  He agrees to plan of care. ? ? ?Final Clinical Impressions(s) / UC Diagnoses  ? ?Final diagnoses:  ?Acute non-recurrent maxillary sinusitis  ?Seasonal allergies  ?Acute cough  ? ? ? ?Discharge Instructions   ? ?   ?Take the prednisone as directed.   ? ?Take the promethazine DM as directed.  Do not drive, operate machinery, or drink alcohol as this medication may cause drowsiness.  ? ?Follow up with your primary care provider if your s

## 2021-05-08 NOTE — Discharge Instructions (Addendum)
Take the prednisone as directed.   ? ?Take the promethazine DM as directed.  Do not drive, operate machinery, or drink alcohol as this medication may cause drowsiness.  ? ?Follow up with your primary care provider if your symptoms are not improving.   ? ?

## 2021-05-10 ENCOUNTER — Ambulatory Visit: Payer: BC Managed Care – PPO | Admitting: Dermatology

## 2021-05-10 ENCOUNTER — Telehealth: Payer: Self-pay | Admitting: Internal Medicine

## 2021-05-10 DIAGNOSIS — L570 Actinic keratosis: Secondary | ICD-10-CM | POA: Diagnosis not present

## 2021-05-10 DIAGNOSIS — Z85828 Personal history of other malignant neoplasm of skin: Secondary | ICD-10-CM | POA: Diagnosis not present

## 2021-05-10 DIAGNOSIS — L814 Other melanin hyperpigmentation: Secondary | ICD-10-CM

## 2021-05-10 DIAGNOSIS — L578 Other skin changes due to chronic exposure to nonionizing radiation: Secondary | ICD-10-CM | POA: Diagnosis not present

## 2021-05-10 DIAGNOSIS — D1800 Hemangioma unspecified site: Secondary | ICD-10-CM

## 2021-05-10 DIAGNOSIS — Z86018 Personal history of other benign neoplasm: Secondary | ICD-10-CM

## 2021-05-10 DIAGNOSIS — Z1283 Encounter for screening for malignant neoplasm of skin: Secondary | ICD-10-CM | POA: Diagnosis not present

## 2021-05-10 DIAGNOSIS — L738 Other specified follicular disorders: Secondary | ICD-10-CM

## 2021-05-10 DIAGNOSIS — D229 Melanocytic nevi, unspecified: Secondary | ICD-10-CM

## 2021-05-10 DIAGNOSIS — L821 Other seborrheic keratosis: Secondary | ICD-10-CM

## 2021-05-10 NOTE — Telephone Encounter (Signed)
Report faxed as requested

## 2021-05-10 NOTE — Progress Notes (Signed)
? ?Follow-Up Visit ?  ?Subjective  ?Kevin Ward is a 56 y.o. male who presents for the following: Annual Exam (1 yrs tbse hx of bcc, dysplastic nevi, aks and isks. Spots at left arm ,prior tick bite at left thigh, and right side of head near temple. ). ?The patient presents for Total-Body Skin Exam (TBSE) for skin cancer screening and mole check.  The patient has spots, moles and lesions to be evaluated, some may be new or changing and the patient has concerns that these could be cancer. ? ?Left thigh hx of tick bite  ? ?The following portions of the chart were reviewed this encounter and updated as appropriate:  Tobacco  Allergies  Meds  Problems  Med Hx  Surg Hx  Fam Hx   ?  ?Review of Systems: No other skin or systemic complaints except as noted in HPI or Assessment and Plan. ? ?Objective  ?Well appearing patient in no apparent distress; mood and affect are within normal limits. ? ?A full examination was performed including scalp, head, eyes, ears, nose, lips, neck, chest, axillae, abdomen, back, buttocks, bilateral upper extremities, bilateral lower extremities, hands, feet, fingers, toes, fingernails, and toenails. All findings within normal limits unless otherwise noted below. ? ?right temple x 1 ?Erythematous thin papules/macules with gritty scale.  ? ? ?Assessment & Plan  ?Actinic keratosis ?right temple x 1 ? ?Actinic keratoses are precancerous spots that appear secondary to cumulative UV radiation exposure/sun exposure over time. They are chronic with expected duration over 1 year. A portion of actinic keratoses will progress to squamous cell carcinoma of the skin. It is not possible to reliably predict which spots will progress to skin cancer and so treatment is recommended to prevent development of skin cancer. ? ?Recommend daily broad spectrum sunscreen SPF 30+ to sun-exposed areas, reapply every 2 hours as needed.  ?Recommend staying in the shade or wearing long sleeves, sun glasses (UVA+UVB  protection) and wide brim hats (4-inch brim around the entire circumference of the hat). ?Call for new or changing lesions. ? ?Destruction of lesion - right temple x 1 ?Complexity: simple   ?Destruction method: cryotherapy   ?Informed consent: discussed and consent obtained   ?Timeout:  patient name, date of birth, surgical site, and procedure verified ?Lesion destroyed using liquid nitrogen: Yes   ?Region frozen until ice ball extended beyond lesion: Yes   ?Outcome: patient tolerated procedure well with no complications   ?Post-procedure details: wound care instructions given   ?Additional details:  Prior to procedure, discussed risks of blister formation, small wound, skin dyspigmentation, or rare scar following cryotherapy. Recommend Vaseline ointment to treated areas while healing. ? ?Lentigines ?- Scattered tan macules ?- Due to sun exposure ?- Benign-appearing, observe ?- Recommend daily broad spectrum sunscreen SPF 30+ to sun-exposed areas, reapply every 2 hours as needed. ?- Call for any changes ? ?Seborrheic Keratoses ?- Stuck-on, waxy, tan-brown papules and/or plaques at back , left thigh ?- Benign-appearing ?- Discussed benign etiology and prognosis. ?- Observe ?- Call for any changes ? ?Sebaceous Hyperplasia at face  ?- Small yellow papules with a central dell ?- Benign ?- Observe ?- Discussed Isotretinoin  ? ?Melanocytic Nevi ?- Tan-brown and/or pink-flesh-colored symmetric macules and papules ?- Benign appearing on exam today ?- Observation ?- Call clinic for new or changing moles ?- Recommend daily use of broad spectrum spf 30+ sunscreen to sun-exposed areas.  ? ?Hemangiomas ?- Red papules ?- Discussed benign nature ?- Observe ?- Call for any  changes ? ?Actinic Damage ?- Chronic condition, secondary to cumulative UV/sun exposure ?- diffuse scaly erythematous macules with underlying dyspigmentation ?- Recommend daily broad spectrum sunscreen SPF 30+ to sun-exposed areas, reapply every 2 hours as  needed.  ?- Staying in the shade or wearing long sleeves, sun glasses (UVA+UVB protection) and wide brim hats (4-inch brim around the entire circumference of the hat) are also recommended for sun protection.  ?- Call for new or changing lesions. ? ?History of Basal Cell Carcinoma of the Skin ?- No evidence of recurrence today at right prox nasal ala inferior ED&C 07/06/20, left forehead 2.5 cm above mid brow 2021 ?- Recommend regular full body skin exams ?- Recommend daily broad spectrum sunscreen SPF 30+ to sun-exposed areas, reapply every 2 hours as needed.  ?- Call if any new or changing lesions are noted between office visits ? ?History of Dysplastic Nevi ?- No evidence of recurrence today at multiple locations ?- Recommend regular full body skin exams ?- Recommend daily broad spectrum sunscreen SPF 30+ to sun-exposed areas, reapply every 2 hours as needed.  ?- Call if any new or changing lesions are noted between office visits ? ?Skin cancer screening performed today. ? ?Return in about 1 year (around 05/11/2022) for TBSE. ? ?I, Ruthell Rummage, CMA, am acting as scribe for Sarina Ser, MD. ?Documentation: I have reviewed the above documentation for accuracy and completeness, and I agree with the above. ? ?Sarina Ser, MD ? ?

## 2021-05-10 NOTE — Patient Instructions (Addendum)
?Actinic keratoses are precancerous spots that appear secondary to cumulative UV radiation exposure/sun exposure over time. They are chronic with expected duration over 1 year. A portion of actinic keratoses will progress to squamous cell carcinoma of the skin. It is not possible to reliably predict which spots will progress to skin cancer and so treatment is recommended to prevent development of skin cancer. ? ?Recommend daily broad spectrum sunscreen SPF 30+ to sun-exposed areas, reapply every 2 hours as needed.  ?Recommend staying in the shade or wearing long sleeves, sun glasses (UVA+UVB protection) and wide brim hats (4-inch brim around the entire circumference of the hat). ?Call for new or changing lesions.  ? ?Cryotherapy Aftercare ? ?Wash gently with soap and water everyday.   ?Apply Vaseline and Band-Aid daily until healed.  ? ?Seborrheic Keratosis ? ?What causes seborrheic keratoses? ?Seborrheic keratoses are harmless, common skin growths that first appear during adult life.  As time goes by, more growths appear.  Some people may develop a large number of them.  Seborrheic keratoses appear on both covered and uncovered body parts.  They are not caused by sunlight.  The tendency to develop seborrheic keratoses can be inherited.  They vary in color from skin-colored to gray, brown, or even black.  They can be either smooth or have a rough, warty surface.   ?Seborrheic keratoses are superficial and look as if they were stuck on the skin.  Under the microscope this type of keratosis looks like layers upon layers of skin.  That is why at times the top layer may seem to fall off, but the rest of the growth remains and re-grows.   ? ?Treatment ?Seborrheic keratoses do not need to be treated, but can easily be removed in the office.  Seborrheic keratoses often cause symptoms when they rub on clothing or jewelry.  Lesions can be in the way of shaving.  If they become inflamed, they can cause itching, soreness, or  burning.  Removal of a seborrheic keratosis can be accomplished by freezing, burning, or surgery. ?If any spot bleeds, scabs, or grows rapidly, please return to have it checked, as these can be an indication of a skin cancer. ? ? ? ?Melanoma ABCDEs ? ?Melanoma is the most dangerous type of skin cancer, and is the leading cause of death from skin disease.  You are more likely to develop melanoma if you: ?Have light-colored skin, light-colored eyes, or red or blond hair ?Spend a lot of time in the sun ?Tan regularly, either outdoors or in a tanning bed ?Have had blistering sunburns, especially during childhood ?Have a close family member who has had a melanoma ?Have atypical moles or large birthmarks ? ?Early detection of melanoma is key since treatment is typically straightforward and cure rates are extremely high if we catch it early.  ? ?The first sign of melanoma is often a change in a mole or a new dark spot.  The ABCDE system is a way of remembering the signs of melanoma. ? ?A for asymmetry:  The two halves do not match. ?B for border:  The edges of the growth are irregular. ?C for color:  A mixture of colors are present instead of an even brown color. ?D for diameter:  Melanomas are usually (but not always) greater than 88m - the size of a pencil eraser. ?E for evolution:  The spot keeps changing in size, shape, and color. ? ?Please check your skin once per month between visits. You can use a  small mirror in front and a large mirror behind you to keep an eye on the back side or your body.  ? ?If you see any new or changing lesions before your next follow-up, please call to schedule a visit. ? ?Please continue daily skin protection including broad spectrum sunscreen SPF 30+ to sun-exposed areas, reapplying every 2 hours as needed when you're outdoors.  ? ?Staying in the shade or wearing long sleeves, sun glasses (UVA+UVB protection) and wide brim hats (4-inch brim around the entire circumference of the hat) are  also recommended for sun protection.   ? ?If You Need Anything After Your Visit ? ?If you have any questions or concerns for your doctor, please call our main line at [phone] and press option 4 to reach your doctor's medical assistant. If no one answers, please leave a voicemail as directed and we will return your call as soon as possible. Messages left after 4 pm will be answered the following business day.  ? ?You may also send [place] a message via MyChart. We typically respond to MyChart messages within 1-2 business days. ? ?For prescription refills, please ask your pharmacy to contact our office. Our fax number is [phone]. ? ?If you have an urgent issue when the clinic is closed that cannot wait until the next business day, you can page your doctor at the number below.   ? ?Please note that while we do our best to be available for urgent issues outside of office hours, we are not available 24/7.  ? ?If you have an urgent issue and are unable to reach [place], you may choose to seek medical care at your doctor's office, retail clinic, urgent care center, or emergency room. ? ?If you have a medical emergency, please immediately call 911 or go to the emergency department. ? ?Pager Numbers ? ?- Dr. Nehemiah Massed: [phone] ? ?- Dr. Laurence Ferrari: [phone] ? ?- Dr. Nicole Kindred: [phone] ? ?In the event of inclement weather, please call our main line at [phone] for an update on the status of any delays or closures. ? ?Dermatology Medication Tips: ?Please keep the boxes that topical medications come in in order to help keep track of the instructions about where and how to use these. Pharmacies typically print the medication instructions only on the boxes and not directly on the medication tubes.  ? ?If your medication is too expensive, please contact our office at [phone] option 4 or send [place] a message through [facility].  ? ?We are unable to tell what your co-pay for medications will be in advance as this is different  depending on your insurance coverage. However, we may be able to find a substitute medication at lower cost or fill out paperwork to get insurance to cover a needed medication.  ? ?If a prior authorization is required to get your medication covered by your insurance company, please allow [place] 1-2 business days to complete this process. ? ?Drug prices often vary depending on where the prescription is filled and some pharmacies may offer cheaper prices. ? ?The website www.goodrx.com contains coupons for medications through different pharmacies. The prices here do not account for what the cost may be with help from insurance (it may be cheaper with your insurance), but the website can give you the price if you did not use any insurance.  ?- You can print the associated coupon and take it with your prescription to the pharmacy.  ?- You may also stop by our office during regular business hours  and pick up a GoodRx coupon card.  ?- If you need your prescription sent electronically to a different pharmacy, notify our office through [facility] or by phone at [phone] option 4. ? ? ? ? ?Si Usted Necesita Algo Despu?s de Su Visita ? ?Tambi?n puede enviarnos un mensaje a trav?s de MyChart. Por lo general respondemos a los mensajes de MyChart en el transcurso de 1 a 2 d?as h?biles. ? ?Para renovar recetas, por favor pida a su farmacia que se ponga en contacto con nuestra oficina. Nuestro n?mero de fax es el [phone]. ? ?Si tiene un asunto urgente cuando la cl?nica est? cerrada y que no puede esperar hasta el siguiente d?a h?bil, puede llamar/localizar a su doctor(a) al n?mero que aparece a continuaci?n.  ? ?Por favor, tenga en cuenta que aunque hacemos todo lo posible para estar disponibles para asuntos urgentes fuera del horario de oficina, no estamos disponibles las 24 horas del d?a, los 7 d?as de la semana.  ? ?Si tiene un problema urgente y no puede comunicarse con nosotros, puede optar por buscar atenci?n  m?dica  en el consultorio de su doctor(a), en una cl?nica privada, en un centro de atenci?n urgente o en una sala de emergencias. ? ?Si tiene una emergencia m?dica, por favor llame inmediatamente al 91

## 2021-05-10 NOTE — Telephone Encounter (Signed)
Anderson Malta from [place] called stating she need a copy of cat scan report one that was done at [facility]  ?[phone]-phone ?[phone] fax number ?

## 2021-05-12 ENCOUNTER — Telehealth: Payer: Self-pay | Admitting: Internal Medicine

## 2021-05-12 NOTE — Telephone Encounter (Signed)
Ct chest 05/09/21 ?Lung scarring and lung nodules <6 mm  ?-if pt was smoker consider repeat in 1 year CT chest  ? ?Mildly dilated pulmonary arteries ? Pulmonary hypertension ?-this can be worked up with lung specialist let me know if referral desired  ?Liver cysts  ?Likely Benign bone lesions and arthritis in spine ? ?

## 2021-05-12 NOTE — Telephone Encounter (Signed)
Lvm for pt to return call in regards to CT results from 05/09/21 ?

## 2021-05-18 ENCOUNTER — Encounter: Payer: Self-pay | Admitting: Dermatology

## 2021-07-06 ENCOUNTER — Encounter: Payer: Self-pay | Admitting: Internal Medicine

## 2021-07-06 ENCOUNTER — Encounter: Payer: BC Managed Care – PPO | Admitting: Internal Medicine

## 2021-07-13 ENCOUNTER — Encounter: Payer: Self-pay | Admitting: Internal Medicine

## 2021-07-13 ENCOUNTER — Ambulatory Visit (INDEPENDENT_AMBULATORY_CARE_PROVIDER_SITE_OTHER): Payer: BC Managed Care – PPO | Admitting: Internal Medicine

## 2021-07-13 VITALS — BP 130/90 | HR 78 | Temp 98.5°F | Resp 14 | Ht 73.0 in | Wt 205.0 lb

## 2021-07-13 DIAGNOSIS — Z0000 Encounter for general adult medical examination without abnormal findings: Secondary | ICD-10-CM

## 2021-07-13 DIAGNOSIS — Z1329 Encounter for screening for other suspected endocrine disorder: Secondary | ICD-10-CM

## 2021-07-13 DIAGNOSIS — R739 Hyperglycemia, unspecified: Secondary | ICD-10-CM

## 2021-07-13 DIAGNOSIS — R7989 Other specified abnormal findings of blood chemistry: Secondary | ICD-10-CM | POA: Diagnosis not present

## 2021-07-13 DIAGNOSIS — E611 Iron deficiency: Secondary | ICD-10-CM

## 2021-07-13 DIAGNOSIS — K219 Gastro-esophageal reflux disease without esophagitis: Secondary | ICD-10-CM

## 2021-07-13 DIAGNOSIS — R5383 Other fatigue: Secondary | ICD-10-CM

## 2021-07-13 DIAGNOSIS — Z125 Encounter for screening for malignant neoplasm of prostate: Secondary | ICD-10-CM

## 2021-07-13 DIAGNOSIS — R030 Elevated blood-pressure reading, without diagnosis of hypertension: Secondary | ICD-10-CM | POA: Diagnosis not present

## 2021-07-13 DIAGNOSIS — K7689 Other specified diseases of liver: Secondary | ICD-10-CM

## 2021-07-13 DIAGNOSIS — Z1389 Encounter for screening for other disorder: Secondary | ICD-10-CM

## 2021-07-13 MED ORDER — BLOOD PRESSURE KIT: 1.0000 | PACK | Freq: Every day | 0 refills | Status: DC

## 2021-07-13 MED ORDER — PANTOPRAZOLE SODIUM 20 MG PO TBEC: 20.0000 mg | DELAYED_RELEASE_TABLET | Freq: Every day | ORAL | Status: DC | PRN

## 2021-07-16 ENCOUNTER — Other Ambulatory Visit (INDEPENDENT_AMBULATORY_CARE_PROVIDER_SITE_OTHER): Payer: BC Managed Care – PPO

## 2021-07-16 DIAGNOSIS — Z1329 Encounter for screening for other suspected endocrine disorder: Secondary | ICD-10-CM | POA: Diagnosis not present

## 2021-07-16 DIAGNOSIS — E611 Iron deficiency: Secondary | ICD-10-CM | POA: Diagnosis not present

## 2021-07-16 DIAGNOSIS — R7989 Other specified abnormal findings of blood chemistry: Secondary | ICD-10-CM

## 2021-07-16 DIAGNOSIS — Z1389 Encounter for screening for other disorder: Secondary | ICD-10-CM

## 2021-07-16 DIAGNOSIS — Z125 Encounter for screening for malignant neoplasm of prostate: Secondary | ICD-10-CM | POA: Diagnosis not present

## 2021-07-16 DIAGNOSIS — Z0000 Encounter for general adult medical examination without abnormal findings: Secondary | ICD-10-CM

## 2021-07-16 DIAGNOSIS — R739 Hyperglycemia, unspecified: Secondary | ICD-10-CM | POA: Diagnosis not present

## 2021-07-16 LAB — CBC WITH DIFFERENTIAL/PLATELET
Basophils Absolute: 0 10*3/uL (ref 0.0–0.1)
Basophils Relative: 0.6 % (ref 0.0–3.0)
Eosinophils Absolute: 0.1 10*3/uL (ref 0.0–0.7)
Eosinophils Relative: 2.2 % (ref 0.0–5.0)
HCT: 44.4 % (ref 39.0–52.0)
Hemoglobin: 15 g/dL (ref 13.0–17.0)
Lymphocytes Relative: 35.9 % (ref 12.0–46.0)
Lymphs Abs: 1.9 10*3/uL (ref 0.7–4.0)
MCHC: 33.9 g/dL (ref 30.0–36.0)
MCV: 92.1 fl (ref 78.0–100.0)
Monocytes Absolute: 0.5 10*3/uL (ref 0.1–1.0)
Monocytes Relative: 8.6 % (ref 3.0–12.0)
Neutro Abs: 2.8 10*3/uL (ref 1.4–7.7)
Neutrophils Relative %: 52.7 % (ref 43.0–77.0)
Platelets: 200 10*3/uL (ref 150.0–400.0)
RBC: 4.82 Mil/uL (ref 4.22–5.81)
RDW: 13.2 % (ref 11.5–15.5)
WBC: 5.3 10*3/uL (ref 4.0–10.5)

## 2021-07-16 LAB — LIPID PANEL
Cholesterol: 216 mg/dL — ABNORMAL HIGH (ref 0–200)
HDL: 50.5 mg/dL (ref >39.00)
NonHDL: 165.63
Total CHOL/HDL Ratio: 4
Triglycerides: 225 mg/dL — ABNORMAL HIGH (ref 0.0–149.0)
VLDL: 45 mg/dL — ABNORMAL HIGH (ref 0.0–40.0)

## 2021-07-16 LAB — COMPREHENSIVE METABOLIC PANEL
ALT: 23 U/L (ref 0–53)
AST: 20 U/L (ref 0–37)
Albumin: 4.7 g/dL (ref 3.5–5.2)
Alkaline Phosphatase: 67 U/L (ref 39–117)
BUN: 19 mg/dL (ref 6–23)
CO2: 30 mEq/L (ref 19–32)
Calcium: 9.8 mg/dL (ref 8.4–10.5)
Chloride: 102 mEq/L (ref 96–112)
Creatinine, Ser: 1.09 mg/dL (ref 0.40–1.50)
GFR: 76.41 mL/min (ref >60.00)
Glucose, Bld: 99 mg/dL (ref 70–99)
Potassium: 4.4 mEq/L (ref 3.5–5.1)
Sodium: 140 mEq/L (ref 135–145)
Total Bilirubin: 0.5 mg/dL (ref 0.2–1.2)
Total Protein: 7.1 g/dL (ref 6.0–8.3)

## 2021-07-16 LAB — PSA: PSA: 1.06 ng/mL (ref 0.10–4.00)

## 2021-07-16 LAB — TESTOSTERONE: Testosterone: 242.72 ng/dL — ABNORMAL LOW (ref 300.00–890.00)

## 2021-07-16 LAB — IBC + FERRITIN
Ferritin: 34.3 ng/mL (ref 22.0–322.0)
Iron: 100 ug/dL (ref 42–165)
Saturation Ratios: 20 % (ref 20.0–50.0)
TIBC: 501.2 ug/dL — ABNORMAL HIGH (ref 250.0–450.0)
Transferrin: 358 mg/dL (ref 212.0–360.0)

## 2021-07-16 LAB — LDL CHOLESTEROL, DIRECT: Direct LDL: 124 mg/dL

## 2021-07-16 LAB — TSH: TSH: 2.45 u[IU]/mL (ref 0.35–5.50)

## 2021-07-16 LAB — HEMOGLOBIN A1C: Hgb A1c MFr Bld: 5.4 % (ref 4.6–6.5)

## 2021-07-17 LAB — URINALYSIS, ROUTINE W REFLEX MICROSCOPIC
Bilirubin Urine: NEGATIVE
Glucose, UA: NEGATIVE
Hgb urine dipstick: NEGATIVE
Ketones, ur: NEGATIVE
Leukocytes,Ua: NEGATIVE
Nitrite: NEGATIVE
Protein, ur: NEGATIVE
Specific Gravity, Urine: 1.02 (ref 1.001–1.035)
pH: 5.5 (ref 5.0–8.0)

## 2021-07-19 ENCOUNTER — Encounter: Payer: Self-pay | Admitting: *Deleted

## 2021-10-27 ENCOUNTER — Ambulatory Visit: Payer: BC Managed Care – PPO | Admitting: Family Medicine

## 2021-10-27 ENCOUNTER — Encounter: Payer: Self-pay | Admitting: Family Medicine

## 2021-10-27 VITALS — BP 130/84 | HR 67 | Temp 98.3°F | Ht 73.0 in | Wt 209.0 lb

## 2021-10-27 DIAGNOSIS — E291 Testicular hypofunction: Secondary | ICD-10-CM

## 2021-10-27 DIAGNOSIS — E782 Mixed hyperlipidemia: Secondary | ICD-10-CM | POA: Diagnosis not present

## 2021-10-27 DIAGNOSIS — I700 Atherosclerosis of aorta: Secondary | ICD-10-CM

## 2021-10-27 DIAGNOSIS — R7989 Other specified abnormal findings of blood chemistry: Secondary | ICD-10-CM | POA: Diagnosis not present

## 2021-10-27 MED ORDER — TADALAFIL 20 MG PO TABS: ORAL_TABLET | ORAL | 0 refills | Status: AC

## 2021-10-27 NOTE — Patient Instructions (Signed)
It was a pleasure meeting you today. Thank you for allowing me to take part in your health care.  Our goals for today as we discussed include:  For your blood pressure Continue to monitor.  Ideally would like blood pressure to be less than 120/80 Will recheck at next visit  For your cholesterol Will send referral for cardiac CT.  This is a 99 dollar self pay exam.  They will call you with an appointment.  If you do not have an appointment in the next 2-3 weeks please notify the clinic.  Recommend Shingles vaccine.  This is a 2 dose series and can be given at your local pharmacy.  Please talk to your pharmacist about this.   Recommend annual Flu vaccine.  Please follow-up with PCP in 1 year for next annual or sooner if needed.  If you have any questions or concerns, please do not hesitate to call the office at [phone].  I look forward to our next visit and until then take care and stay safe.  Regards,   Carollee Leitz, MD   [facility]

## 2021-10-27 NOTE — Assessment & Plan Note (Signed)
ASCVD risk 6.2%.  Borderline risk. Reviewed recent labs,  LDL 124 goal <100.  Discussed initiation of statin therapy. Patient would prefer to hold off as just joined gym.  Discussed performing calcium scoring CT to determine risk of heart disease.  Would like to proceed with imaging. -Cardiac CT ordered -Continue lifestyle management -Follow up with results

## 2021-10-27 NOTE — Progress Notes (Signed)
    SUBJECTIVE:   CHIEF COMPLAINT / HPI: transfer of care  Patient presents to clinic to transfer care.  No acute concerns today  Hyperlipidemia Not currently on medications.  Would prefer to try lifestyle management.  ED Requesting refill for Cialis.  Was previously seen by Urology for low Testosterone and was initially taking testosterone injections, not currently on injections.  Still having some fatigue and low energy.  Has now signed up for gym to try to improve some energy level. Reports weight increased over the past few years.   PERTINENT  PMH / PSH:  ED HLD  OBJECTIVE:   BP 130/84 (BP Location: Left Arm, Patient Position: Sitting, Cuff Size: Normal)   Pulse 67   Temp 98.3 F (36.8 C) (Oral)   Ht '6\' 1"'$  (1.854 m)   Wt 209 lb (94.8 kg)   SpO2 98%   BMI 27.57 kg/m    General: Alert, no acute distress Cardio: Normal S1 and S2, RRR, no r/m/g Pulm: CTAB, normal work of breathing Abdomen: Bowel sounds normal. Abdomen soft and non-tender.  Extremities: No peripheral edema.   ASSESSMENT/PLAN:   Hyperlipidemia ASCVD risk 6.2%.  Borderline risk. Reviewed recent labs,  LDL 124 goal <100.  Discussed initiation of statin therapy. Patient would prefer to hold off as just joined gym.  Discussed performing calcium scoring CT to determine risk of heart disease.  Would like to proceed with imaging. 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  Stable. Previously evaluated by urology.  Take Cilais as needed.   -Refill Cialis 20 mg 1 tab 1 hr prior to sexual activity, x 10 tabs   PDMP Reviewed  Carollee Leitz, MD

## 2021-10-28 ENCOUNTER — Encounter: Payer: Self-pay | Admitting: Family Medicine

## 2021-10-28 NOTE — Assessment & Plan Note (Signed)
Chronic.  Stable. Previously evaluated by urology.  Take Cilais as needed.   -Refill Cialis 20 mg 1 tab 1 hr prior to sexual activity, x 10 tabs

## 2021-11-08 ENCOUNTER — Ambulatory Visit
Admission: RE | Admit: 2021-11-08 | Discharge: 2021-11-08 | Disposition: A | Discharge status: Home / Self Care | Payer: BC Managed Care – PPO | Source: Ambulatory Visit | Attending: Family Medicine | Admitting: Family Medicine

## 2021-11-08 DIAGNOSIS — E782 Mixed hyperlipidemia: Secondary | ICD-10-CM | POA: Insufficient documentation

## 2021-11-10 ENCOUNTER — Other Ambulatory Visit: Payer: BC Managed Care – PPO

## 2021-11-10 ENCOUNTER — Encounter: Payer: Self-pay | Admitting: Family Medicine

## 2021-11-12 NOTE — Telephone Encounter (Signed)
Pt called wanting to know the results of his ct scan

## 2022-01-12 ENCOUNTER — Ambulatory Visit: Payer: BC Managed Care – PPO | Admitting: Family Medicine

## 2022-02-01 DIAGNOSIS — M545 Low back pain, unspecified: Secondary | ICD-10-CM | POA: Diagnosis not present

## 2022-02-03 DIAGNOSIS — D2321 Other benign neoplasm of skin of right ear and external auricular canal: Secondary | ICD-10-CM | POA: Diagnosis not present

## 2022-02-03 DIAGNOSIS — K219 Gastro-esophageal reflux disease without esophagitis: Secondary | ICD-10-CM | POA: Diagnosis not present

## 2022-02-03 DIAGNOSIS — H6123 Impacted cerumen, bilateral: Secondary | ICD-10-CM | POA: Diagnosis not present

## 2022-03-20 DIAGNOSIS — M545 Low back pain, unspecified: Secondary | ICD-10-CM | POA: Diagnosis not present

## 2022-03-26 ENCOUNTER — Ambulatory Visit
Admission: EM | Admit: 2022-03-26 | Discharge: 2022-03-26 | Disposition: A | Discharge status: Home / Self Care | Payer: BC Managed Care – PPO | Attending: Emergency Medicine | Admitting: Emergency Medicine

## 2022-03-26 DIAGNOSIS — R051 Acute cough: Secondary | ICD-10-CM | POA: Diagnosis not present

## 2022-03-26 DIAGNOSIS — J0100 Acute maxillary sinusitis, unspecified: Secondary | ICD-10-CM

## 2022-03-26 MED ORDER — PROMETHAZINE-DM 6.25-15 MG/5ML PO SYRP: 5.0000 mL | ORAL_SOLUTION | Freq: Four times a day (QID) | ORAL | 0 refills | Status: DC | PRN

## 2022-03-26 MED ORDER — AMOXICILLIN 875 MG PO TABS: 875.0000 mg | ORAL_TABLET | Freq: Two times a day (BID) | ORAL | 0 refills | Status: AC

## 2022-03-26 NOTE — ED Triage Notes (Signed)
Patient to Urgent Care with complaints of sore throat, headaches, and cough. Sinus drainage/ sinus congestion.   Symptoms started 7 days ago. Denies any known fevers. Has taken otc cold/ flu medications with little relief.

## 2022-03-26 NOTE — ED Provider Notes (Signed)
Kevin Ward    CSN: [id number] Arrival date & time: 03/26/22  [id number]      History   Chief Complaint Chief Complaint  Patient presents with   Headache   Cough    HPI Kevin Ward is a 57 y.o. male.  Patient presents with 7 day history of congestion, sinus pressure, postnasal drip, sore throat, cough, headache.  The cough is keeping him awake at night.  He denies fever, chills, rash, ear pain, shortness of breath, or other symptoms.  Treatment attempted with OTC cold and cough medication.      The history is provided by the patient and medical records.    Past Medical History:  Diagnosis Date   Abnormal MRI, thoracic spine 06/01/2020   Allergy    declined allergy shots in past used allergy drops not helpful; mold, grasses, dust   Annual physical exam 06/15/2018   Basal cell carcinoma    right above eyebrow   Basal cell carcinoma 06/25/2019   left forehead 2.5cm above mid brow   BCC (basal cell carcinoma of skin) 06/01/2020   right prox nasal ala inferior, EDC 07/06/20   BCC (basal cell carcinoma of skin) 06/01/2020   right prox nasal ala inferior, EDC scheduled 07/06/20   COVID-19    11/23/18   COVID-19 virus detected 11/23/2018   Dysplastic nevus 05/14/2012   Right lower back. Mild atypia, deep margin involved.   Dysplastic nevus 05/14/2012   Left upper back. Mild atypia. Limited margins free.   Dysplastic nevus 06/25/2013   Right inf. lat. buttock. Moderate atypia. Margins involved.   Dysplastic nevus 06/25/2013   Right proximal anterior thigh. Mild atypia. Margins close   Dysplastic nevus 10/05/2015   Post. neck left paraspinal. Mild atypia. Lateral margin involved.   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/2020    Thoracic spondylosis 06/01/2020   Cervical herniated disc 06/01/2020   Spinal stenosis of cervical region 06/01/2020   Abnormal MRI, thoracic spine 06/01/2020   Liver cyst 05/11/2020   Lung nodules 05/11/2020   Aortic atherosclerosis ([place]) 05/11/2020   Erectile dysfunction due to arterial insufficiency 10/25/2019   Hypogonadism in male 07/04/2019   Low testosterone in male 07/04/2019   Allergic rhinitis 06/15/2018   Hyperlipidemia 03/14/2018   Gastroesophageal reflux disease without esophagitis 03/14/2018    Past Surgical History:  Procedure Laterality Date   FOOT SURGERY     [place] neuroma right foot Dr. 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 Authorizing Provider  amoxicillin (AMOXIL) 875 MG tablet Take 1 tablet (875 mg total) by mouth 2 (two) times daily for 7 days. 03/26/22 04/02/22 Yes Sharion Balloon, NP  promethazine-dextromethorphan (PROMETHAZINE-DM) 6.25-15 MG/5ML syrup Take 5 mLs by mouth 4 (four) times daily as needed. 03/26/22  Yes Sharion Balloon, NP  Blood Pressure KIT 1 Device by Does not apply route daily. omron 07/13/21   McLean-Scocuzza, Nino Glow, MD  meloxicam (MOBIC) 15 MG tablet Take 1 tablet (15 mg total) by mouth daily. 03/15/21   Hyatt, Max T, DPM  Multiple Vitamin (MULTI-VITAMIN DAILY PO) Take by mouth.    [provider]  pantoprazole (PROTONIX) 20 MG tablet Take 1 tablet (20 mg total) by mouth daily as needed. 07/13/21   McLean-Scocuzza, Nino Glow, MD  tadalafil (CIALIS) 20 MG tablet 1 tab 1 hour prior to intercourse 10/27/21   Carollee Leitz, MD    Family History Family History  Problem Relation Age of Onset   Cancer Mother        breast   Hypertension Mother    Diabetes Mother    Alcohol abuse Father    Hyperlipidemia Brother    Depression Son    Cancer Brother         kidney cancer    Social History Social History   Tobacco Use   Smoking status: Never   Smokeless tobacco: Former   Tobacco comments:    former snuff 15 years ago from 02/2018 occasion.  Substance Use Topics   Alcohol use: No   Drug use: No     Allergies   Patient has no known allergies.   Review of Systems Review of Systems  Constitutional:  Negative for chills and fever.  HENT:  Positive for congestion, postnasal drip, rhinorrhea, sinus pressure and sore throat. Negative for ear pain.   Respiratory:  Positive for cough. Negative for shortness of breath.   Cardiovascular:  Negative for chest pain and palpitations.  Gastrointestinal:  Negative for diarrhea and vomiting.  Skin:  Negative for color change and rash.  All other systems reviewed and are negative.    Physical Exam Triage Vital Signs ED Triage Vitals [03/26/22 0943]  Enc Vitals Group     BP 138/87     Pulse Rate 70     Resp 18     Temp 98.2 F (36.8 C)     Temp src      SpO2 98 %     Weight      Height      Head Circumference      Peak Flow      Pain Score 5     Pain Loc      Pain Edu?      Excl. in [place]?    No data found.  Updated Vital Signs BP 138/87   Pulse 70   Temp 98.2 F (36.8 C)   Resp 18   SpO2 98%   Visual Acuity Right Eye Distance:   Left Eye Distance:   Bilateral Distance:    Right Eye Near:   Left Eye Near:    Bilateral Near:     Physical Exam Vitals and nursing note reviewed.  Constitutional:      General: He is not in acute distress.    Appearance: Normal appearance. He is well-developed. He is not ill-appearing.  HENT:     Right Ear: Tympanic membrane normal.     Left Ear: Tympanic membrane normal.     Nose: Congestion present.     Mouth/Throat:     Mouth: Mucous membranes are moist.     Pharynx: Oropharynx is clear.     Comments: Clear PND. Cardiovascular:     Rate and Rhythm: Normal rate and regular rhythm.     Heart sounds: Normal heart sounds.   Pulmonary:     Effort: Pulmonary effort is normal. No respiratory distress.     Breath sounds:  Normal breath sounds.  Musculoskeletal:     Cervical back: Neck supple.  Skin:    General: Skin is warm and dry.  Neurological:     Mental Status: He is alert.  Psychiatric:        Mood and Affect: Mood normal.        Behavior: Behavior normal.      UC Treatments / Results  Labs (all labs ordered are listed, but only abnormal results are displayed) Labs Reviewed - No data to display  EKG   Radiology No results found.  Procedures Procedures (including critical care time)  Medications Ordered in UC Medications - No data to display  Initial Impression / Assessment and Plan / UC Course  I have reviewed the triage vital signs and the nursing notes.  Pertinent labs & imaging results that were available during my care of the patient were reviewed by me and considered in my medical decision making (see chart for details).   Acute sinusitis, cough.  Patient has been symptomatic for 7 days and is not improving with OTC treatment.  Treating today with amoxicillin.  His cough has been keeping him up at night.  Treating with Promethazine DM; precautions for drowsiness with this medication discussed.  Education provided on sinus infection and cough.  Instructed patient to follow up with his PCP if his symptoms are not improving.  He agrees to plan of care.     Final Clinical Impressions(s) / UC Diagnoses   Final diagnoses:  Acute non-recurrent maxillary sinusitis  Acute cough     Discharge Instructions      Take the amoxicillin as directed.    Take the promethazine DM as directed.  Do not drive, operate machinery, drink alcohol, or perform dangerous activities while taking this medication as it may cause drowsiness.  Follow up with your primary care provider if your symptoms are not improving.        ED Prescriptions     Medication Sig Dispense Auth. Provider    amoxicillin (AMOXIL) 875 MG tablet Take 1 tablet (875 mg total) by mouth 2 (two) times daily for 7 days. 14 tablet Sharion Balloon, NP   promethazine-dextromethorphan (PROMETHAZINE-DM) 6.25-15 MG/5ML syrup Take 5 mLs by mouth 4 (four) times daily as needed. 118 mL Sharion Balloon, NP      PDMP not reviewed this encounter.   Sharion Balloon, NP 03/26/22 1037

## 2022-03-26 NOTE — Discharge Instructions (Addendum)
Take the amoxicillin as directed.    Take the promethazine DM as directed.  Do not drive, operate machinery, drink alcohol, or perform dangerous activities while taking this medication as it may cause drowsiness.  Follow up with your primary care provider if your symptoms are not improving.

## 2022-05-09 ENCOUNTER — Ambulatory Visit (INDEPENDENT_AMBULATORY_CARE_PROVIDER_SITE_OTHER): Payer: BC Managed Care – PPO | Admitting: Family

## 2022-05-09 ENCOUNTER — Encounter: Payer: Self-pay | Admitting: Family

## 2022-05-09 VITALS — BP 136/82 | HR 76 | Temp 100.0°F | Ht 73.0 in | Wt 199.2 lb

## 2022-05-09 DIAGNOSIS — J029 Acute pharyngitis, unspecified: Secondary | ICD-10-CM | POA: Diagnosis not present

## 2022-05-09 DIAGNOSIS — J40 Bronchitis, not specified as acute or chronic: Secondary | ICD-10-CM

## 2022-05-09 LAB — POCT RAPID STREP A (OFFICE): Rapid Strep A Screen: NEGATIVE

## 2022-05-09 LAB — POCT INFLUENZA A/B
Influenza A, POC: NEGATIVE
Influenza B, POC: NEGATIVE

## 2022-05-09 LAB — POC COVID19 BINAXNOW: SARS Coronavirus 2 Ag: NEGATIVE

## 2022-05-09 MED ORDER — PREDNISONE 10 MG PO TABS: ORAL_TABLET | ORAL | 0 refills | Status: DC

## 2022-05-09 MED ORDER — AZITHROMYCIN 250 MG PO TABS: ORAL_TABLET | ORAL | 0 refills | Status: AC

## 2022-05-09 MED ORDER — GUAIFENESIN-CODEINE 100-10 MG/5ML PO SYRP
5.0000 mL | ORAL_SOLUTION | Freq: Every evening | ORAL | 0 refills | Status: DC | PRN
Start: 2022-05-09 — End: 2022-08-13

## 2022-05-09 NOTE — Progress Notes (Unsigned)
   Assessment & Plan:  There are no diagnoses linked to this encounter.   Return precautions given.   Risks, benefits, and alternatives of the medications and treatment plan prescribed today were discussed, and patient expressed understanding.   Education regarding symptom management and diagnosis given to patient on AVS either electronically or printed.  No follow-ups on file.  Rennie Plowman, FNP  Subjective:    Patient ID: Kevin Ward, male    DOB: December 09, 1965, 57 y.o.   MRN: [id number]  CC: Kevin Ward is a 57 y.o. male who presents today for an acute visit.    HPI: Complains of dry cough, nasal congestion x 4 days  Cough is hurting his throat. Cough is worse at night. Sneezing.   Endorses chills. No vision changes, HA, cp, sob, wheezing, left arm numbness.   Dry eyes , using refresh.   He went on fishing trip on [place].   No h/o seasonal allergies which resolved after h/o sinus surgery  Taking mucinex DM, promethazine DM.     Former snuff 15 years ago  Seen in urgent care 03/26/2022 for sinusitis and prescribed amoxicillin 875 twice daily, Promethazine DM with resolution of symptoms.   Allergies: Patient has no known allergies. Current Outpatient Medications on File Prior to Visit  Medication Sig Dispense Refill   Blood Pressure KIT 1 Device by Does not apply route daily. omron 1 kit 0   meloxicam (MOBIC) 15 MG tablet Take 1 tablet (15 mg total) by mouth daily. 30 tablet 3   Multiple Vitamin (MULTI-VITAMIN DAILY PO) Take by mouth.     pantoprazole (PROTONIX) 20 MG tablet Take 1 tablet (20 mg total) by mouth daily as needed.     promethazine-dextromethorphan (PROMETHAZINE-DM) 6.25-15 MG/5ML syrup Take 5 mLs by mouth 4 (four) times daily as needed. 118 mL 0   tadalafil (CIALIS) 20 MG tablet 1 tab 1 hour prior to intercourse 10 tablet 0   No current facility-administered medications on file prior to visit.    Review of Systems    Objective:    BP 136/82   Pulse  76   Temp 100 F (37.8 C) (Oral)   Ht  (1.854 m)   Wt 199 lb 3.2 oz (90.4 kg)   SpO2 98%   BMI 26.28 kg/m   BP Readings from Last 3 Encounters:  05/09/22 136/82  03/26/22 138/87  10/27/21 130/84   Wt Readings from Last 3 Encounters:  05/09/22 199 lb 3.2 oz (90.4 kg)  10/27/21 209 lb (94.8 kg)  07/13/21 205 lb (93 kg)    Physical Exam

## 2022-05-11 NOTE — Patient Instructions (Addendum)
Start azithromycin. Ensure to take probiotics while on antibiotics and also for 2 weeks after completion. This can either be by eating yogurt daily or taking a probiotic supplement over the counter such as Culturelle.It is important to re-colonize the gut with good bacteria and also to prevent any diarrheal infections associated with antibiotic use.   I have given you a codeine-based cough syrup for bedtime.  You may continue Mucinex DM during the day  Please take cough medication at night only as needed. As we discussed, I do not recommend dosing throughout the day as coughing is a protective mechanism . It also helps to break up thick mucous.  Do not take cough suppressants with alcohol as can lead to trouble breathing. Advise caution if taking cough suppressant and operating machinery ( i.e driving a car) as you may feel very tired.     If after couple days you are not feeling better, please and start prednisone taper.  Please ensure you get all prednisone doses and prior to 12-week mark as prednisone can interfere with sleep. Please let me know if any new concerns or worsening symptoms

## 2022-05-11 NOTE — Assessment & Plan Note (Addendum)
Afebrile.  No acute respiratory distress.  Negative strep, flu and COVID.  Start zpak due to persistent nature of cough and concern for bacterial URI.  Advised after couple days if symptoms or not improving he may then start prednisone.  Provided codeine-based cough syrup for bedtime.  He will let me know how he is doing

## 2022-05-12 ENCOUNTER — Ambulatory Visit: Payer: BC Managed Care – PPO | Admitting: Dermatology

## 2022-05-12 VITALS — BP 127/73 | HR 77

## 2022-05-12 DIAGNOSIS — Z1283 Encounter for screening for malignant neoplasm of skin: Secondary | ICD-10-CM | POA: Diagnosis not present

## 2022-05-12 DIAGNOSIS — L578 Other skin changes due to chronic exposure to nonionizing radiation: Secondary | ICD-10-CM

## 2022-05-12 DIAGNOSIS — Z85828 Personal history of other malignant neoplasm of skin: Secondary | ICD-10-CM

## 2022-05-12 DIAGNOSIS — Z86018 Personal history of other benign neoplasm: Secondary | ICD-10-CM | POA: Diagnosis not present

## 2022-05-12 DIAGNOSIS — D229 Melanocytic nevi, unspecified: Secondary | ICD-10-CM

## 2022-05-12 DIAGNOSIS — Z7189 Other specified counseling: Secondary | ICD-10-CM

## 2022-05-12 DIAGNOSIS — L570 Actinic keratosis: Secondary | ICD-10-CM

## 2022-05-12 DIAGNOSIS — L738 Other specified follicular disorders: Secondary | ICD-10-CM

## 2022-05-12 DIAGNOSIS — L821 Other seborrheic keratosis: Secondary | ICD-10-CM

## 2022-05-12 DIAGNOSIS — L814 Other melanin hyperpigmentation: Secondary | ICD-10-CM

## 2022-05-12 NOTE — Patient Instructions (Addendum)
Actinic keratoses are precancerous spots that appear secondary to cumulative UV radiation exposure/sun exposure over time. They are chronic with expected duration over 1 year. A portion of actinic keratoses will progress to squamous cell carcinoma of the skin. It is not possible to reliably predict which spots will progress to skin cancer and so treatment is recommended to prevent development of skin cancer.  Recommend daily broad spectrum sunscreen SPF 30+ to sun-exposed areas, reapply every 2 hours as needed.  Recommend staying in the shade or wearing long sleeves, sun glasses (UVA+UVB protection) and wide brim hats (4-inch brim around the entire circumference of the hat). Call for new or changing lesions.    Cryotherapy Aftercare  Wash gently with soap and water everyday.   Apply Vaseline and Band-Aid daily until healed.        Melanoma ABCDEs  Melanoma is the most dangerous type of skin cancer, and is the leading cause of death from skin disease.  You are more likely to develop melanoma if you: Have light-colored skin, light-colored eyes, or red or blond hair Spend a lot of time in the sun Tan regularly, either outdoors or in a tanning bed Have had blistering sunburns, especially during childhood Have a close family member who has had a melanoma Have atypical moles or large birthmarks  Early detection of melanoma is key since treatment is typically straightforward and cure rates are extremely high if we catch it early.   The first sign of melanoma is often a change in a mole or a new dark spot.  The ABCDE system is a way of remembering the signs of melanoma.  A for asymmetry:  The two halves do not match. B for border:  The edges of the growth are irregular. C for color:  A mixture of colors are present instead of an even brown color. D for diameter:  Melanomas are usually (but not always) greater than 6mm - the size of a pencil eraser. E for evolution:  The spot keeps  changing in size, shape, and color.  Please check your skin once per month between visits. You can use a small mirror in front and a large mirror behind you to keep an eye on the back side or your body.   If you see any new or changing lesions before your next follow-up, please call to schedule a visit.  Please continue daily skin protection including broad spectrum sunscreen SPF 30+ to sun-exposed areas, reapplying every 2 hours as needed when you're outdoors.   Staying in the shade or wearing long sleeves, sun glasses (UVA+UVB protection) and wide brim hats (4-inch brim around the entire circumference of the hat) are also recommended for sun protection.    Due to recent changes in healthcare laws, you may see results of your pathology and/or laboratory studies on MyChart before the doctors have had a chance to review them. We understand that in some cases there may be results that are confusing or concerning to you. Please understand that not all results are received at the same time and often the doctors may need to interpret multiple results in order to provide you with the best plan of care or course of treatment. Therefore, we ask that you please give us 2 business days to thoroughly review all your results before contacting the office for clarification. Should we see a critical lab result, you will be contacted sooner.   If You Need Anything After Your Visit  If you have any questions   or concerns for your doctor, please call our main line at [phone] and press option 4 to reach your doctor's medical assistant. If no one answers, please leave a voicemail as directed and we will return your call as soon as possible. Messages left after 4 pm will be answered the following business day.   You may also send us a message via MyChart. We typically respond to MyChart messages within 1-2 business days.  For prescription refills, please ask your pharmacy to contact our office. Our fax number is  [phone].  If you have an urgent issue when the clinic is closed that cannot wait until the next business day, you can page your doctor at the number below.    Please note that while we do our best to be available for urgent issues outside of office hours, we are not available 24/7.   If you have an urgent issue and are unable to reach us, you may choose to seek medical care at your doctor's office, retail clinic, urgent care center, or emergency room.  If you have a medical emergency, please immediately call 911 or go to the emergency department.  Pager Numbers  - Dr. Kowalski: [phone]  - Dr. Moye: [phone]  - Dr. Stewart: [phone]  In the event of inclement weather, please call our main line at [phone] for an update on the status of any delays or closures.  Dermatology Medication Tips: Please keep the boxes that topical medications come in in order to help keep track of the instructions about where and how to use these. Pharmacies typically print the medication instructions only on the boxes and not directly on the medication tubes.   If your medication is too expensive, please contact our office at [phone] option 4 or send us a message through MyChart.   We are unable to tell what your co-pay for medications will be in advance as this is different depending on your insurance coverage. However, we may be able to find a substitute medication at lower cost or fill out paperwork to get insurance to cover a needed medication.   If a prior authorization is required to get your medication covered by your insurance company, please allow us 1-2 business days to complete this process.  Drug prices often vary depending on where the prescription is filled and some pharmacies may offer cheaper prices.  The website www.goodrx.com contains coupons for medications through different pharmacies. The prices here do not account for what the cost may be with help from  insurance (it may be cheaper with your insurance), but the website can give you the price if you did not use any insurance.  - You can print the associated coupon and take it with your prescription to the pharmacy.  - You may also stop by our office during regular business hours and pick up a GoodRx coupon card.  - If you need your prescription sent electronically to a different pharmacy, notify our office through [facility] MyChart or by phone at [phone] option 4.     Si Usted Necesita Algo Despus de Su Visita  Tambin puede enviarnos un mensaje a travs de MyChart. Por lo general respondemos a los mensajes de MyChart en el transcurso de 1 a 2 das hbiles.  Para renovar recetas, por favor pida a su farmacia que se ponga en contacto con nuestra oficina. Nuestro nmero de fax es el [phone].  Si tiene un asunto urgente cuando la clnica est cerrada y que no puede   esperar hasta el siguiente da hbil, puede llamar/localizar a su doctor(a) al nmero que aparece a continuacin.   Por favor, tenga en cuenta que aunque hacemos todo lo posible para estar disponibles para asuntos urgentes fuera del horario de oficina, no estamos disponibles las 24 horas del da, los 7 das de la semana.   Si tiene un problema urgente y no puede comunicarse con nosotros, puede optar por buscar atencin mdica  en el consultorio de su doctor(a), en una clnica privada, en un centro de atencin urgente o en una sala de emergencias.  Si tiene una emergencia mdica, por favor llame inmediatamente al 911 o vaya a la sala de emergencias.  Nmeros de bper  - Dr. Kowalski: [phone]  - Dra. Moye: [phone]  - Dra. Stewart: [phone]  En caso de inclemencias del tiempo, por favor llame a nuestra lnea principal al [phone] para una actualizacin sobre el estado de cualquier retraso o cierre.  Consejos para la medicacin en dermatologa: Por favor, guarde las cajas en las que vienen los  medicamentos de uso tpico para ayudarle a seguir las instrucciones sobre dnde y cmo usarlos. Las farmacias generalmente imprimen las instrucciones del medicamento slo en las cajas y no directamente en los tubos del medicamento.   Si su medicamento es muy caro, por favor, pngase en contacto con nuestra oficina llamando al [phone] y presione la opcin 4 o envenos un mensaje a travs de MyChart.   No podemos decirle cul ser su copago por los medicamentos por adelantado ya que esto es diferente dependiendo de la cobertura de su seguro. Sin embargo, es posible que podamos encontrar un medicamento sustituto a menor costo o llenar un formulario para que el seguro cubra el medicamento que se considera necesario.   Si se requiere una autorizacin previa para que su compaa de seguros cubra su medicamento, por favor permtanos de 1 a 2 das hbiles para completar este proceso.  Los precios de los medicamentos varan con frecuencia dependiendo del lugar de dnde se surte la receta y alguna farmacias pueden ofrecer precios ms baratos.  El sitio web www.goodrx.com tiene cupones para medicamentos de diferentes farmacias. Los precios aqu no tienen en cuenta lo que podra costar con la ayuda del seguro (puede ser ms barato con su seguro), pero el sitio web puede darle el precio si no utiliz ningn seguro.  - Puede imprimir el cupn correspondiente y llevarlo con su receta a la farmacia.  - Tambin puede pasar por nuestra oficina durante el horario de atencin regular y recoger una tarjeta de cupones de GoodRx.  - Si necesita que su receta se enve electrnicamente a una farmacia diferente, informe a nuestra oficina a travs de MyChart de [facility] o por telfono llamando al [phone] y presione la opcin 4.  

## 2022-05-12 NOTE — Progress Notes (Signed)
Follow-Up Visit   Subjective  Kevin Ward is a 57 y.o. male who presents for the following: Skin Cancer Screening and Full Body Skin Exam Patient would like scalp checked. Hx of bcc, hx dysplastic  The patient presents for Total-Body Skin Exam (TBSE) for skin cancer screening and mole check. The patient has spots, moles and lesions to be evaluated, some may be new or changing and the patient has concerns that these could be cancer.  The following portions of the chart were reviewed this encounter and updated as appropriate: medications, allergies, medical history  Review of Systems:  No other skin or systemic complaints except as noted in HPI or Assessment and Plan.  Objective  Well appearing patient in no apparent distress; mood and affect are within normal limits.  A full examination was performed including scalp, head, eyes, ears, nose, lips, neck, chest, axillae, abdomen, back, buttocks, bilateral upper extremities, bilateral lower extremities, hands, feet, fingers, toes, fingernails, and toenails. All findings within normal limits unless otherwise noted below.   Relevant physical exam findings are noted in the Assessment and Plan.  scalp x 2 (2) Erythematous thin papules/macules with gritty scale.    Assessment & Plan   LENTIGINES, SEBORRHEIC KERATOSES, HEMANGIOMAS - Benign normal skin lesions - Benign-appearing - Call for any changes  Sebaceous Hyperplasia - Small yellow papules with a central dell - Benign-appearing - Observe. Call for changes.  Isotretinoin medication to help shrink oil glands   Isotretinoin Counseling; Review and Contraception Counseling: Reviewed potential side effects of isotretinoin including xerosis, cheilitis, hepatitis, hyperlipidemia, and severe birth defects if taken by a pregnant woman.  Women on isotretinoin must be celibate (not having sex) or required to use at least 2 birth control methods to prevent pregnancy (unless patient is a  male of non-child bearing potential).  Females of child-bearing potential must have monthly pregnancy tests while on isotretinoin and report through [employer] (FDA monitoring program). Reviewed reports of suicidal ideation in those with a history of depression while taking isotretinoin and reports of diagnosis of inflammatory bowl disease (IBD) while taking isotretinoin as well as the lack of evidence for a causal relationship between isotretinoin, depression and IBD. Patient advised to reach out with any questions or concerns. Patient advised not to share pills or donate blood while on treatment or for one month after completing treatment. All patient's considering Isotretinoin must read and understand and sign Isotretinoin Consent Form and be registered with [employer].  May want to consider in future.  MELANOCYTIC NEVI - Tan-brown and/or pink-flesh-colored symmetric macules and papules - Benign appearing on exam today - Observation - Call clinic for new or changing moles - Recommend daily use of broad spectrum spf 30+ sunscreen to sun-exposed areas.   Blood under right great toenail   Exam:  Treatment Plan: May be from a prior injury   ACTINIC DAMAGE - Chronic condition, secondary to cumulative UV/sun exposure - diffuse scaly erythematous macules with underlying dyspigmentation - Recommend daily broad spectrum sunscreen SPF 30+ to sun-exposed areas, reapply every 2 hours as needed.  - Staying in the shade or wearing long sleeves, sun glasses (UVA+UVB protection) and wide brim hats (4-inch brim around the entire circumference of the hat) are also recommended for sun protection.  - Call for new or changing lesions.  HISTORY OF BASAL CELL CARCINOMA OF THE SKIN Multiple locations see history - No evidence of recurrence today - Recommend regular full body skin exams - Recommend daily broad spectrum sunscreen SPF  30+ to sun-exposed areas, reapply every 2 hours as needed.  - Call if any new  or changing lesions are noted between office visits  HISTORY OF DYSPLASTIC NEVUS Multiple locations see history  No evidence of recurrence today Recommend regular full body skin exams Recommend daily broad spectrum sunscreen SPF 30+ to sun-exposed areas, reapply every 2 hours as needed.  Call if any new or changing lesions are noted between office visits  SKIN CANCER SCREENING PERFORMED TODAY.  Actinic keratosis (2) scalp x 2  Actinic keratoses are precancerous spots that appear secondary to cumulative UV radiation exposure/sun exposure over time. They are chronic with expected duration over 1 year. A portion of actinic keratoses will progress to squamous cell carcinoma of the skin. It is not possible to reliably predict which spots will progress to skin cancer and so treatment is recommended to prevent development of skin cancer.  Recommend daily broad spectrum sunscreen SPF 30+ to sun-exposed areas, reapply every 2 hours as needed.  Recommend staying in the shade or wearing long sleeves, sun glasses (UVA+UVB protection) and wide brim hats (4-inch brim around the entire circumference of the hat). Call for new or changing lesions.  Destruction of lesion - scalp x 2 Complexity: simple   Destruction method: cryotherapy   Informed consent: discussed and consent obtained   Timeout:  patient name, date of birth, surgical site, and procedure verified Lesion destroyed using liquid nitrogen: Yes   Region frozen until ice ball extended beyond lesion: Yes   Outcome: patient tolerated procedure well with no complications   Post-procedure details: wound care instructions given    Skin cancer screening  Actinic skin damage  History of basal cell carcinoma  History of dysplastic nevus  Counseling and coordination of care  Sebaceous hyperplasia of face  Seborrheic keratosis   Return in about 1 year (around 05/12/2023) for TBSE.  IAsher Muir, CMA, am acting as scribe for Armida Sans, MD.   Documentation: I have reviewed the above documentation for accuracy and completeness, and I agree with the above.  Armida Sans, MD

## 2022-05-20 ENCOUNTER — Encounter: Payer: Self-pay | Admitting: Dermatology

## 2022-05-30 ENCOUNTER — Encounter: Payer: Self-pay | Admitting: Family

## 2022-05-30 NOTE — Telephone Encounter (Signed)
LVM to call back to office  

## 2022-05-30 NOTE — Telephone Encounter (Signed)
Patient states he is returning call from Jenate [place], CMA.  I spoke with Jenate and transferred call to her.

## 2022-05-30 NOTE — Telephone Encounter (Signed)
Scheduled appt for 06/01/22

## 2022-06-01 ENCOUNTER — Ambulatory Visit: Admission: RE | Admit: 2022-06-01 | Payer: BC Managed Care – PPO | Source: Ambulatory Visit | Admitting: *Deleted

## 2022-06-01 ENCOUNTER — Encounter: Payer: Self-pay | Admitting: Family

## 2022-06-01 ENCOUNTER — Ambulatory Visit: Payer: BC Managed Care – PPO | Admitting: Family

## 2022-06-01 ENCOUNTER — Ambulatory Visit
Admission: RE | Admit: 2022-06-01 | Discharge: 2022-06-01 | Disposition: A | Discharge status: Home / Self Care | Payer: BC Managed Care – PPO | Source: Ambulatory Visit | Attending: Family | Admitting: Family

## 2022-06-01 VITALS — BP 130/72 | HR 71 | Temp 98.4°F | Ht 73.0 in | Wt 199.0 lb

## 2022-06-01 DIAGNOSIS — J40 Bronchitis, not specified as acute or chronic: Secondary | ICD-10-CM

## 2022-06-01 DIAGNOSIS — R059 Cough, unspecified: Secondary | ICD-10-CM | POA: Diagnosis not present

## 2022-06-01 MED ORDER — PREDNISONE 10 MG PO TABS
ORAL_TABLET | ORAL | 0 refills | Status: DC
Start: 2022-06-01 — End: 2022-08-13

## 2022-06-01 MED ORDER — ALBUTEROL SULFATE HFA 108 (90 BASE) MCG/ACT IN AERS
2.0000 | INHALATION_SPRAY | Freq: Four times a day (QID) | RESPIRATORY_TRACT | 0 refills | Status: AC | PRN
Start: 2022-06-01 — End: ?

## 2022-06-01 NOTE — Progress Notes (Signed)
Assessment & Plan:  Bronchitis Assessment & Plan: Reassuring exam without evidence of lung sounds.  Pending chest x-ray to ensure no pneumonia.  Patient had felt better on 4 day course prednisone.  I have sent in another course of prednisone.  Advised over-the-counter Zyrtec.  Provided patient with albuterol inhaler to be used prior to exercise.    Orders: -     predniSONE; Take 40 mg by mouth on day 1, then taper 10 mg daily until gone  Dispense: 10 tablet; Refill: 0 -     Albuterol Sulfate HFA; Inhale 2 puffs into the lungs every 6 (six) hours as needed for wheezing or shortness of breath.  Dispense: 8 g; Refill: 0 -     DG Chest 2 View; Future     Return precautions given.   Risks, benefits, and alternatives of the medications and treatment plan prescribed today were discussed, and patient expressed understanding.   Education regarding symptom management and diagnosis given to patient on AVS either electronically or printed.  No follow-ups on file.  Rennie Plowman, FNP  Subjective:    Patient ID: Kevin Ward, male    DOB: 03-27-1965, 57 y.o.   MRN: [id number]  CC: Kevin Ward is a 57 y.o. male who presents today for an acute visit.    HPI: Cough has improved however he will hear a 'rattle' from time to time.  He continues to feel 'sluggish'. Clear congestion with cough and when he blows his nose.  He has been back in the gym and he will notice that he will start coughing.  No fever ,chills, wheezing, leg swelling.  He felt better on the prednisone   Follow-up today as he is heading out of town this weekend he is not quite "100%"  Treated with azithromycin, prednisone  previously seen for bronchitis 05/09/2022   Allergies: Patient has no known allergies. Current Outpatient Medications on File Prior to Visit  Medication Sig Dispense Refill   Blood Pressure KIT 1 Device by Does not apply route daily. omron 1 kit 0   guaiFENesin-codeine (ROBITUSSIN AC) 100-10 MG/5ML  syrup Take 5 mLs by mouth at bedtime as needed for cough. 70 mL 0   meloxicam (MOBIC) 15 MG tablet Take 1 tablet (15 mg total) by mouth daily. 30 tablet 3   Multiple Vitamin (MULTI-VITAMIN DAILY PO) Take by mouth.     pantoprazole (PROTONIX) 20 MG tablet Take 1 tablet (20 mg total) by mouth daily as needed.     promethazine-dextromethorphan (PROMETHAZINE-DM) 6.25-15 MG/5ML syrup Take 5 mLs by mouth 4 (four) times daily as needed. 118 mL 0   tadalafil (CIALIS) 20 MG tablet 1 tab 1 hour prior to intercourse 10 tablet 0   No current facility-administered medications on file prior to visit.    Review of Systems  Constitutional:  Negative for chills and fever.  HENT:  Positive for congestion.   Respiratory:  Positive for cough. Negative for shortness of breath and wheezing.   Cardiovascular:  Negative for chest pain and palpitations.  Gastrointestinal:  Negative for nausea and vomiting.      Objective:    BP 130/72   Pulse 71   Temp 98.4 F (36.9 C) (Oral)   Ht 6\' 1"  (1.854 m)   Wt 199 lb (90.3 kg)   SpO2 99%   BMI 26.25 kg/m   BP Readings from Last 3 Encounters:  06/01/22 130/72  05/12/22 127/73  05/09/22 136/82   Wt Readings from Last 3  Encounters:  06/01/22 199 lb (90.3 kg)  05/09/22 199 lb 3.2 oz (90.4 kg)  10/27/21 209 lb (94.8 kg)    Physical Exam Vitals reviewed.  Constitutional:      Appearance: He is well-developed.  HENT:     Head: Normocephalic and atraumatic.     Right Ear: Hearing, tympanic membrane, ear canal and external ear normal. No decreased hearing noted. No drainage, swelling or tenderness. No middle ear effusion. Tympanic membrane is not injected, erythematous or bulging.     Left Ear: Hearing, tympanic membrane, ear canal and external ear normal. No decreased hearing noted. No drainage, swelling or tenderness.  No middle ear effusion. Tympanic membrane is not injected, erythematous or bulging.     Nose: Nose normal.     Right Sinus: No maxillary  sinus tenderness or frontal sinus tenderness.     Left Sinus: No maxillary sinus tenderness or frontal sinus tenderness.     Mouth/Throat:     Pharynx: Uvula midline. No oropharyngeal exudate or posterior oropharyngeal erythema.     Tonsils: No tonsillar abscesses.  Eyes:     Conjunctiva/sclera: Conjunctivae normal.  Cardiovascular:     Rate and Rhythm: Regular rhythm.     Heart sounds: Normal heart sounds.  Pulmonary:     Effort: Pulmonary effort is normal. No respiratory distress.     Breath sounds: Normal breath sounds. No wheezing, rhonchi or rales.  Lymphadenopathy:     Head:     Right side of head: No submental, submandibular, tonsillar, preauricular, posterior auricular or occipital adenopathy.     Left side of head: No submental, submandibular, tonsillar, preauricular, posterior auricular or occipital adenopathy.     Cervical: No cervical adenopathy.  Skin:    General: Skin is warm and dry.  Neurological:     Mental Status: He is alert.  Psychiatric:        Speech: Speech normal.        Behavior: Behavior normal.

## 2022-06-01 NOTE — Patient Instructions (Signed)
Start prednisone.  Please ensure you get all doses in prior to noon each day.  I provided you with albuterol inhaler.  You may start over-the-counter antihistamine such as Zyrtec or Claritin.  Use albuterol every 6 hours for first 24 hours to get good medication into the lungs and loosen congestion; after, you may use as needed and eventually stop all together when cough resolves.  Please have chest x-ray obtained at [facility] medical

## 2022-06-01 NOTE — Assessment & Plan Note (Addendum)
Reassuring exam without evidence of lung sounds.  Pending chest x-ray to ensure no pneumonia.  Patient had felt better on 4 day course prednisone.  I have sent in another course of prednisone.  Advised over-the-counter Zyrtec.  Provided patient with albuterol inhaler to be used prior to exercise.

## 2022-06-15 ENCOUNTER — Ambulatory Visit: Payer: BC Managed Care – PPO | Admitting: Podiatry

## 2022-06-15 ENCOUNTER — Encounter: Payer: Self-pay | Admitting: Podiatry

## 2022-06-15 DIAGNOSIS — G609 Hereditary and idiopathic neuropathy, unspecified: Secondary | ICD-10-CM | POA: Diagnosis not present

## 2022-06-15 DIAGNOSIS — M778 Other enthesopathies, not elsewhere classified: Secondary | ICD-10-CM

## 2022-06-15 MED ORDER — MELOXICAM 15 MG PO TABS: 15.0000 mg | ORAL_TABLET | Freq: Every day | ORAL | 3 refills | Status: AC

## 2022-06-15 NOTE — Progress Notes (Signed)
He presents today concerned about the numbness and tingling that he gets in his toes he states that is not bad during the day while he has his shoes and socks on and he is up and about.  He notices the majority of the pain after his shoes and socks come off he is in his recliner or in bed.  States that he still gets the numbness and tingling even when he is waking up first thing in the morning.  States that it does not wake him from sleep however.  He is also concerned about discoloration to the hallux nail plate bilateral.  Has recently purchased new shoes which does help with the plantar fasciitis.  Objective: Vital signs are stable he is alert and oriented x 3 pulses are palpable.  Hammertoe deformities cavus foot type bilaterally.  Does have a very light discoloration to the hallux nails bilaterally most likely subungual hematoma does not demonstrate any type of systemic disease state.  No pain on palpation medial calcaneal tubercles.  When he was checked with Phoebe Perch monofilament he was unable to feel any sensation to the level of the knuckles from the tips of the toes.  Proximal to the metatarsal phalangeal joints he was able to feel everything very thoroughly.  Deep tendon reflexes are normal muscle strength is normal and symmetrical bilateral.  Assessment: Idiopathic neuropathy.  Subungual hematomas hallux bilateral.  Plan: At this point we will send him to neurology for evaluation and treatment of the neuropathy.  Like him to be able to diagnose that and treat him since he does not have any type of systemic disease state that would typically result in a neuropathy.  I will follow-up with him once that is complete.

## 2022-06-27 ENCOUNTER — Telehealth: Payer: Self-pay | Admitting: Podiatry

## 2022-06-27 DIAGNOSIS — G609 Hereditary and idiopathic neuropathy, unspecified: Secondary | ICD-10-CM

## 2022-06-27 NOTE — Telephone Encounter (Signed)
Pt left message today at 204pm stating he was seen about 1.5 weeks ago and was to be referred to neurologist and Dr Al Corpus otld him to call us if he had not heard anything.  Reviewed chart and called pt back and gave him the number to [facility] for him to call them.   In the referral it says that hey have left 2 messages for pt .

## 2022-06-27 NOTE — Telephone Encounter (Signed)
Pt called back and left message stating he thought he was being referred to the neurologist at [facility] clinic. That is what he thought was discussed. He would like to stay in [place] if possible.  I returned call and told pt I would send the message to Dr Al Corpus to see if he would refer you to the [facility] clinic but it would be tomorrow that he will hear back from me. He said ok.

## 2022-06-28 NOTE — Telephone Encounter (Signed)
Notified pt that referral was sent to [facility] clinic neurology and if he did not hear from them in a couple of days to give them a call. He said ok and thank you

## 2022-07-11 ENCOUNTER — Encounter: Payer: Self-pay | Admitting: Family Medicine

## 2022-07-12 ENCOUNTER — Other Ambulatory Visit: Payer: Self-pay | Admitting: Family Medicine

## 2022-07-12 DIAGNOSIS — E611 Iron deficiency: Secondary | ICD-10-CM

## 2022-07-12 DIAGNOSIS — R7309 Other abnormal glucose: Secondary | ICD-10-CM

## 2022-07-12 DIAGNOSIS — E785 Hyperlipidemia, unspecified: Secondary | ICD-10-CM

## 2022-07-12 DIAGNOSIS — Z125 Encounter for screening for malignant neoplasm of prostate: Secondary | ICD-10-CM

## 2022-07-12 DIAGNOSIS — I700 Atherosclerosis of aorta: Secondary | ICD-10-CM

## 2022-07-15 ENCOUNTER — Other Ambulatory Visit (INDEPENDENT_AMBULATORY_CARE_PROVIDER_SITE_OTHER): Payer: BC Managed Care – PPO

## 2022-07-15 DIAGNOSIS — E785 Hyperlipidemia, unspecified: Secondary | ICD-10-CM | POA: Diagnosis not present

## 2022-07-15 DIAGNOSIS — E611 Iron deficiency: Secondary | ICD-10-CM

## 2022-07-15 DIAGNOSIS — Z125 Encounter for screening for malignant neoplasm of prostate: Secondary | ICD-10-CM

## 2022-07-15 DIAGNOSIS — I700 Atherosclerosis of aorta: Secondary | ICD-10-CM | POA: Diagnosis not present

## 2022-07-15 LAB — LIPID PANEL
Cholesterol: 209 mg/dL — ABNORMAL HIGH (ref 0–200)
HDL: 44.4 mg/dL (ref >39.00)
LDL Cholesterol: 130 mg/dL — ABNORMAL HIGH (ref 0–99)
NonHDL: 164.91
Total CHOL/HDL Ratio: 5
Triglycerides: 173 mg/dL — ABNORMAL HIGH (ref 0.0–149.0)
VLDL: 34.6 mg/dL (ref 0.0–40.0)

## 2022-07-15 LAB — COMPREHENSIVE METABOLIC PANEL
ALT: 38 U/L (ref 0–53)
AST: 26 U/L (ref 0–37)
Albumin: 4.5 g/dL (ref 3.5–5.2)
Alkaline Phosphatase: 74 U/L (ref 39–117)
BUN: 19 mg/dL (ref 6–23)
CO2: 30 mEq/L (ref 19–32)
Calcium: 9.8 mg/dL (ref 8.4–10.5)
Chloride: 101 mEq/L (ref 96–112)
Creatinine, Ser: 0.97 mg/dL (ref 0.40–1.50)
GFR: 87.27 mL/min (ref >60.00)
Glucose, Bld: 88 mg/dL (ref 70–99)
Potassium: 4 mEq/L (ref 3.5–5.1)
Sodium: 140 mEq/L (ref 135–145)
Total Bilirubin: 0.9 mg/dL (ref 0.2–1.2)
Total Protein: 6.7 g/dL (ref 6.0–8.3)

## 2022-07-15 LAB — CBC WITH DIFFERENTIAL/PLATELET
Basophils Absolute: 0 10*3/uL (ref 0.0–0.1)
Basophils Relative: 0.5 % (ref 0.0–3.0)
Eosinophils Absolute: 0.2 10*3/uL (ref 0.0–0.7)
Eosinophils Relative: 2.8 % (ref 0.0–5.0)
HCT: 43.7 % (ref 39.0–52.0)
Hemoglobin: 14.9 g/dL (ref 13.0–17.0)
Lymphocytes Relative: 33.3 % (ref 12.0–46.0)
Lymphs Abs: 2.3 10*3/uL (ref 0.7–4.0)
MCHC: 34.1 g/dL (ref 30.0–36.0)
MCV: 90.7 fl (ref 78.0–100.0)
Monocytes Absolute: 0.7 10*3/uL (ref 0.1–1.0)
Monocytes Relative: 9.6 % (ref 3.0–12.0)
Neutro Abs: 3.7 10*3/uL (ref 1.4–7.7)
Neutrophils Relative %: 53.8 % (ref 43.0–77.0)
Platelets: 210 10*3/uL (ref 150.0–400.0)
RBC: 4.81 Mil/uL (ref 4.22–5.81)
RDW: 13.5 % (ref 11.5–15.5)
WBC: 6.8 10*3/uL (ref 4.0–10.5)

## 2022-07-15 LAB — PSA: PSA: 0.92 ng/mL (ref 0.10–4.00)

## 2022-07-18 ENCOUNTER — Ambulatory Visit: Payer: BC Managed Care – PPO | Admitting: Family Medicine

## 2022-07-18 VITALS — BP 118/84 | HR 64 | Temp 98.1°F | Ht 73.0 in | Wt 205.0 lb

## 2022-07-18 DIAGNOSIS — Z125 Encounter for screening for malignant neoplasm of prostate: Secondary | ICD-10-CM

## 2022-07-18 DIAGNOSIS — I700 Atherosclerosis of aorta: Secondary | ICD-10-CM | POA: Diagnosis not present

## 2022-07-18 DIAGNOSIS — E785 Hyperlipidemia, unspecified: Secondary | ICD-10-CM

## 2022-07-18 DIAGNOSIS — R739 Hyperglycemia, unspecified: Secondary | ICD-10-CM

## 2022-07-18 DIAGNOSIS — Z0000 Encounter for general adult medical examination without abnormal findings: Secondary | ICD-10-CM | POA: Diagnosis not present

## 2022-07-18 DIAGNOSIS — E611 Iron deficiency: Secondary | ICD-10-CM

## 2022-07-18 NOTE — Patient Instructions (Signed)
It was a pleasure meeting you today. Thank you for allowing me to take part in your health care.  Our goals for today as we discussed include:  Follow up 1 year for annual physical Schedule lab appointment 1 week prior to visit.  Fast 10 hours   Recommend Shingles vaccine.  This is a 2 dose series and can be given at your local pharmacy.  Please talk to your pharmacist about this.      If you have any questions or concerns, please do not hesitate to call the office at [phone].  I look forward to our next visit and until then take care and stay safe.  Regards,   Dana Allan, MD   [facility]

## 2022-07-18 NOTE — Progress Notes (Signed)
SUBJECTIVE:   Chief Complaint  Patient presents with   Annual Exam   HPI Patient presents to clinic for annual physical.  No acute concerns  Planning for upcoming trip to [place].  Reviewed recent blood work with patient.   PERTINENT PMH / PSH: HLD  OBJECTIVE:  BP 118/84 (BP Location: Left Arm, Patient Position: Sitting, Cuff Size: Normal)   Pulse 64   Temp 98.1 F (36.7 C) (Oral)   Ht 6\' 1"  (1.854 m)   Wt 205 lb (93 kg)   SpO2 97%   BMI 27.05 kg/m    Physical Exam Vitals reviewed.  Constitutional:      General: He is not in acute distress.    Appearance: Normal appearance. He is normal weight. He is not ill-appearing, toxic-appearing or diaphoretic.  HENT:     Right Ear: Tympanic membrane, ear canal and external ear normal.     Left Ear: Tympanic membrane, ear canal and external ear normal.  Eyes:     General:        Right eye: No discharge.        Left eye: No discharge.  Neck:     Thyroid: No thyromegaly or thyroid tenderness.  Cardiovascular:     Rate and Rhythm: Normal rate and regular rhythm.     Heart sounds: Normal heart sounds.  Pulmonary:     Effort: Pulmonary effort is normal.     Breath sounds: Normal breath sounds.  Abdominal:     General: Bowel sounds are normal.  Musculoskeletal:        General: Normal range of motion.     Cervical back: Normal range of motion.  Skin:    General: Skin is warm and dry.  Neurological:     Mental Status: He is alert and oriented to person, place, and time. Mental status is at baseline.  Psychiatric:        Mood and Affect: Mood normal.        Behavior: Behavior normal.        Thought Content: Thought content normal.        Judgment: Judgment normal.        07/18/2022    1:41 PM 06/01/2022   12:05 PM 05/09/2022    3:52 PM 10/27/2021    9:14 AM 07/13/2021    2:39 PM  Depression screen PHQ 2/9  Decreased Interest 0 0 0 0 0  Down, Depressed, Hopeless 0 0  0 0  PHQ - 2 Score 0 0 0 0 0  Altered  sleeping 0 0     Tired, decreased energy 0 0     Change in appetite 0 0     Feeling bad or failure about yourself  0 0     Trouble concentrating 0 0     Moving slowly or fidgety/restless 0 0     Suicidal thoughts 0 0     PHQ-9 Score 0 0     Difficult doing work/chores Not difficult at all Not difficult at all         07/18/2022    1:42 PM 06/01/2022   12:06 PM 07/02/2020    3:15 PM 04/02/2019   10:10 AM  GAD 7 : Generalized Anxiety Score  Nervous, Anxious, on Edge 0 0 0 0  Control/stop worrying 0 0 0 0  Worry too much - different things 0 0 0 0  Trouble relaxing 0 0 0 0  Restless 0 0 0 0  Easily annoyed  or irritable 0 0 0 0  Afraid - awful might happen 0 0 0 0  Total GAD 7 Score 0 0 0 0  Anxiety Difficulty Not difficult at all Not difficult at all Not difficult at all Not difficult at all   The 10-year ASCVD risk score (Arnett DK, et al., 2019) is: 6.2%   Values used to calculate the score:     Age: 57 years     Sex: Male     Is Non-Hispanic African American: No     Diabetic: No     Tobacco smoker: No     Systolic Blood Pressure: 118 mmHg     Is BP treated: No     HDL Cholesterol: 44.4 mg/dL     Total Cholesterol: 209 mg/dL  ASSESSMENT/PLAN:  Annual physical exam Assessment & Plan: Declines shingles vaccine Colonoscopy up-to-date.  Due 03/28 Hepatitis C/HIV screening completed PSA screening completed. PHQ-9/GAD screening completed Hypertension screening completed Lipid screening completed.  ASCVD risk of 6.2%.  CAC score 0.   Prostate cancer screening -     PSA; Future  Aortic atherosclerosis (HCC) Assessment & Plan: Chronic. Noted on CT angio chest 04/2020. Mild aortic atherosclerosis Recent CAC score 0 No indication to start statin therapy at this time Continue to encourage healthy lifestyle and increase activity Check fasting lipids annually    Orders: -     Lipid panel; Future -     Comprehensive metabolic panel; Future  Hyperlipidemia, unspecified  hyperlipidemia type -     Lipid panel; Future  Hyperglycemia -     Hemoglobin A1c; Future  Iron deficiency -     CBC with Differential/Platelet; Future   PDMP reviewed  Return in about 1 year (around 07/18/2023) for annual visit with fasting labs 1 week prior, PCP.  Dana Allan, MD

## 2022-07-26 ENCOUNTER — Encounter: Payer: BC Managed Care – PPO | Admitting: Family Medicine

## 2022-08-13 ENCOUNTER — Encounter: Payer: Self-pay | Admitting: Family Medicine

## 2022-08-13 NOTE — Assessment & Plan Note (Signed)
Declines shingles vaccine Colonoscopy up-to-date.  Due 03/28 Hepatitis C/HIV screening completed PSA screening completed. PHQ-9/GAD screening completed Hypertension screening completed Lipid screening completed.  ASCVD risk of 6.2%.  CAC score 0.

## 2022-08-13 NOTE — Assessment & Plan Note (Addendum)
Chronic. Noted on CT angio chest 04/2020. Mild aortic atherosclerosis Recent CAC score 0 No indication to start statin therapy at this time Continue to encourage healthy lifestyle and increase activity Check fasting lipids annually

## 2022-09-07 DIAGNOSIS — G629 Polyneuropathy, unspecified: Secondary | ICD-10-CM | POA: Diagnosis not present

## 2022-09-07 DIAGNOSIS — Z131 Encounter for screening for diabetes mellitus: Secondary | ICD-10-CM | POA: Diagnosis not present

## 2022-09-23 DIAGNOSIS — H52223 Regular astigmatism, bilateral: Secondary | ICD-10-CM | POA: Diagnosis not present

## 2022-09-23 DIAGNOSIS — H524 Presbyopia: Secondary | ICD-10-CM | POA: Diagnosis not present

## 2022-10-04 DIAGNOSIS — R200 Anesthesia of skin: Secondary | ICD-10-CM | POA: Diagnosis not present

## 2022-10-04 DIAGNOSIS — R202 Paresthesia of skin: Secondary | ICD-10-CM | POA: Diagnosis not present

## 2022-11-17 DIAGNOSIS — M9913 Subluxation complex (vertebral) of lumbar region: Secondary | ICD-10-CM | POA: Diagnosis not present

## 2022-11-17 DIAGNOSIS — M9911 Subluxation complex (vertebral) of cervical region: Secondary | ICD-10-CM | POA: Diagnosis not present

## 2022-11-17 DIAGNOSIS — M5030 Other cervical disc degeneration, unspecified cervical region: Secondary | ICD-10-CM | POA: Diagnosis not present

## 2022-11-21 DIAGNOSIS — M9911 Subluxation complex (vertebral) of cervical region: Secondary | ICD-10-CM | POA: Diagnosis not present

## 2022-11-21 DIAGNOSIS — M5030 Other cervical disc degeneration, unspecified cervical region: Secondary | ICD-10-CM | POA: Diagnosis not present

## 2022-11-21 DIAGNOSIS — M9913 Subluxation complex (vertebral) of lumbar region: Secondary | ICD-10-CM | POA: Diagnosis not present

## 2022-11-24 DIAGNOSIS — M9911 Subluxation complex (vertebral) of cervical region: Secondary | ICD-10-CM | POA: Diagnosis not present

## 2022-11-24 DIAGNOSIS — M9913 Subluxation complex (vertebral) of lumbar region: Secondary | ICD-10-CM | POA: Diagnosis not present

## 2022-11-24 DIAGNOSIS — M5030 Other cervical disc degeneration, unspecified cervical region: Secondary | ICD-10-CM | POA: Diagnosis not present

## 2022-11-26 DIAGNOSIS — Z23 Encounter for immunization: Secondary | ICD-10-CM | POA: Diagnosis not present

## 2022-11-26 DIAGNOSIS — S61011A Laceration without foreign body of right thumb without damage to nail, initial encounter: Secondary | ICD-10-CM | POA: Diagnosis not present

## 2022-11-28 DIAGNOSIS — M9911 Subluxation complex (vertebral) of cervical region: Secondary | ICD-10-CM | POA: Diagnosis not present

## 2022-11-28 DIAGNOSIS — M5030 Other cervical disc degeneration, unspecified cervical region: Secondary | ICD-10-CM | POA: Diagnosis not present

## 2022-11-28 DIAGNOSIS — M9913 Subluxation complex (vertebral) of lumbar region: Secondary | ICD-10-CM | POA: Diagnosis not present

## 2022-12-01 DIAGNOSIS — M9913 Subluxation complex (vertebral) of lumbar region: Secondary | ICD-10-CM | POA: Diagnosis not present

## 2022-12-01 DIAGNOSIS — M5030 Other cervical disc degeneration, unspecified cervical region: Secondary | ICD-10-CM | POA: Diagnosis not present

## 2022-12-01 DIAGNOSIS — M9911 Subluxation complex (vertebral) of cervical region: Secondary | ICD-10-CM | POA: Diagnosis not present

## 2022-12-05 DIAGNOSIS — M9911 Subluxation complex (vertebral) of cervical region: Secondary | ICD-10-CM | POA: Diagnosis not present

## 2022-12-05 DIAGNOSIS — M9913 Subluxation complex (vertebral) of lumbar region: Secondary | ICD-10-CM | POA: Diagnosis not present

## 2022-12-05 DIAGNOSIS — M5030 Other cervical disc degeneration, unspecified cervical region: Secondary | ICD-10-CM | POA: Diagnosis not present

## 2022-12-12 DIAGNOSIS — M9913 Subluxation complex (vertebral) of lumbar region: Secondary | ICD-10-CM | POA: Diagnosis not present

## 2022-12-12 DIAGNOSIS — M9911 Subluxation complex (vertebral) of cervical region: Secondary | ICD-10-CM | POA: Diagnosis not present

## 2022-12-12 DIAGNOSIS — M5030 Other cervical disc degeneration, unspecified cervical region: Secondary | ICD-10-CM | POA: Diagnosis not present

## 2022-12-13 DIAGNOSIS — M9911 Subluxation complex (vertebral) of cervical region: Secondary | ICD-10-CM | POA: Diagnosis not present

## 2022-12-13 DIAGNOSIS — M9913 Subluxation complex (vertebral) of lumbar region: Secondary | ICD-10-CM | POA: Diagnosis not present

## 2022-12-13 DIAGNOSIS — M5030 Other cervical disc degeneration, unspecified cervical region: Secondary | ICD-10-CM | POA: Diagnosis not present

## 2022-12-19 DIAGNOSIS — M9911 Subluxation complex (vertebral) of cervical region: Secondary | ICD-10-CM | POA: Diagnosis not present

## 2022-12-19 DIAGNOSIS — M9913 Subluxation complex (vertebral) of lumbar region: Secondary | ICD-10-CM | POA: Diagnosis not present

## 2022-12-19 DIAGNOSIS — M5030 Other cervical disc degeneration, unspecified cervical region: Secondary | ICD-10-CM | POA: Diagnosis not present

## 2022-12-22 DIAGNOSIS — M9911 Subluxation complex (vertebral) of cervical region: Secondary | ICD-10-CM | POA: Diagnosis not present

## 2022-12-22 DIAGNOSIS — M5030 Other cervical disc degeneration, unspecified cervical region: Secondary | ICD-10-CM | POA: Diagnosis not present

## 2022-12-22 DIAGNOSIS — M9913 Subluxation complex (vertebral) of lumbar region: Secondary | ICD-10-CM | POA: Diagnosis not present

## 2022-12-26 DIAGNOSIS — M5030 Other cervical disc degeneration, unspecified cervical region: Secondary | ICD-10-CM | POA: Diagnosis not present

## 2022-12-26 DIAGNOSIS — M9913 Subluxation complex (vertebral) of lumbar region: Secondary | ICD-10-CM | POA: Diagnosis not present

## 2022-12-26 DIAGNOSIS — M9911 Subluxation complex (vertebral) of cervical region: Secondary | ICD-10-CM | POA: Diagnosis not present

## 2022-12-29 DIAGNOSIS — M5030 Other cervical disc degeneration, unspecified cervical region: Secondary | ICD-10-CM | POA: Diagnosis not present

## 2022-12-29 DIAGNOSIS — M9911 Subluxation complex (vertebral) of cervical region: Secondary | ICD-10-CM | POA: Diagnosis not present

## 2022-12-29 DIAGNOSIS — M9913 Subluxation complex (vertebral) of lumbar region: Secondary | ICD-10-CM | POA: Diagnosis not present

## 2023-01-02 DIAGNOSIS — G250 Essential tremor: Secondary | ICD-10-CM | POA: Diagnosis not present

## 2023-01-02 DIAGNOSIS — G629 Polyneuropathy, unspecified: Secondary | ICD-10-CM | POA: Diagnosis not present

## 2023-01-02 DIAGNOSIS — M9911 Subluxation complex (vertebral) of cervical region: Secondary | ICD-10-CM | POA: Diagnosis not present

## 2023-01-02 DIAGNOSIS — M9913 Subluxation complex (vertebral) of lumbar region: Secondary | ICD-10-CM | POA: Diagnosis not present

## 2023-01-02 DIAGNOSIS — M5030 Other cervical disc degeneration, unspecified cervical region: Secondary | ICD-10-CM | POA: Diagnosis not present

## 2023-01-05 DIAGNOSIS — M9911 Subluxation complex (vertebral) of cervical region: Secondary | ICD-10-CM | POA: Diagnosis not present

## 2023-01-05 DIAGNOSIS — M9913 Subluxation complex (vertebral) of lumbar region: Secondary | ICD-10-CM | POA: Diagnosis not present

## 2023-01-05 DIAGNOSIS — M5030 Other cervical disc degeneration, unspecified cervical region: Secondary | ICD-10-CM | POA: Diagnosis not present

## 2023-01-06 ENCOUNTER — Ambulatory Visit: Payer: BC Managed Care – PPO | Admitting: Family Medicine

## 2023-01-06 ENCOUNTER — Encounter: Payer: Self-pay | Admitting: Family Medicine

## 2023-01-06 VITALS — BP 116/69 | HR 70 | Temp 98.4°F | Resp 18 | Ht 73.0 in | Wt 199.0 lb

## 2023-01-06 DIAGNOSIS — J0141 Acute recurrent pansinusitis: Secondary | ICD-10-CM

## 2023-01-06 MED ORDER — SACCHAROMYCES BOULARDII 250 MG PO CAPS
250.0000 mg | ORAL_CAPSULE | Freq: Every day | ORAL | 0 refills | Status: DC
Start: 2023-01-06 — End: 2023-03-03

## 2023-01-06 MED ORDER — DEXTROMETHORPHAN HBR 15 MG/5ML PO SYRP
10.0000 mL | ORAL_SOLUTION | Freq: Four times a day (QID) | ORAL | 1 refills | Status: DC | PRN
Start: 2023-01-06 — End: 2023-03-03

## 2023-01-06 MED ORDER — AMOXICILLIN-POT CLAVULANATE 875-125 MG PO TABS
1.0000 | ORAL_TABLET | Freq: Two times a day (BID) | ORAL | 0 refills | Status: AC
Start: 2023-01-06 — End: 2023-01-11

## 2023-01-06 NOTE — Progress Notes (Unsigned)
SUBJECTIVE:   Chief Complaint  Patient presents with   Cough    Starting this am she started coughing up green mucus.   HPI Presents for acute visit  Discussed the use of AI scribe software for clinical note transcription with the patient, who gave verbal consent to proceed.  History of Present Illness The patient, with a history of sinusitis, presented with a five-day history of sinus symptoms that began with a sensation of nasal congestion and rhinorrhea. Over the course of the week, the patient experienced progressive sinus pressure and headaches. Despite self-management with Mucinex Sinus Max and a pulsating neti rinse, the symptoms persisted and began to localize to the lower sinuses. The patient also reported coughing up bright green sputum and experiencing a sore throat, particularly upon waking. They denied any fever. The patient expressed concern about the progression of symptoms, comparing it to a similar episode in April. 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 The patient also mentioned receiving an 'immunity plus IV' at a health bar, though they were unsure of its effectiveness. They denied any shortness of breath or need for an albuterol inhaler. The patient has a history of sinus surgery, which has generally improved their sinus issues.    PERTINENT PMH / PSH: As above  OBJECTIVE:  BP 116/69   Pulse 70   Temp 98.4 F (36.9 C)   Resp 18   Ht 6\' 1"  (1.854 m)   Wt 199 lb (90.3 kg)   SpO2 97%   BMI 26.25 kg/m    Physical Exam Vitals reviewed.  Constitutional:      General: He is not in acute distress.    Appearance: Normal appearance. He is not ill-appearing, toxic-appearing or diaphoretic.  HENT:     Right Ear: Tympanic membrane, ear canal and external ear normal.     Left Ear: Tympanic membrane, ear canal and external ear normal.     Nose: No congestion or rhinorrhea.     Right Turbinates: Pale.      Left Turbinates: Pale.     Mouth/Throat:     Mouth: Mucous membranes are moist.     Pharynx: No oropharyngeal exudate or posterior oropharyngeal erythema.  Eyes:     General:        Right eye: No discharge.        Left eye: No discharge.  Cardiovascular:     Rate and Rhythm: Normal rate and regular rhythm.     Heart sounds: Normal heart sounds.  Pulmonary:     Effort: Pulmonary effort is normal.     Breath sounds: Normal breath sounds.  Abdominal:     General: Bowel sounds are normal.  Musculoskeletal:        General: Normal range of motion.     Cervical back: Normal range of motion.  Skin:    General: Skin is warm and dry.  Neurological:     Mental Status: He is alert and oriented to person, place, and time. Mental status is at baseline.  Psychiatric:        Mood and Affect: Mood normal.        Behavior: Behavior normal.        Thought Content: Thought content normal.        Judgment: Judgment normal.        01/06/2023    1:43 PM 07/18/2022    1:41 PM 06/01/2022   12:05 PM 05/09/2022  3:52 PM 10/27/2021    9:14 AM  Depression screen PHQ 2/9  Decreased Interest 0 0 0 0 0  Down, Depressed, Hopeless 0 0 0  0  PHQ - 2 Score 0 0 0 0 0  Altered sleeping 0 0 0    Tired, decreased energy 0 0 0    Change in appetite 0 0 0    Feeling bad or failure about yourself  0 0 0    Trouble concentrating 0 0 0    Moving slowly or fidgety/restless 0 0 0    Suicidal thoughts 0 0 0    PHQ-9 Score 0 0 0    Difficult doing work/chores Not difficult at all Not difficult at all Not difficult at all        01/06/2023    1:43 PM 07/18/2022    1:42 PM 06/01/2022   12:06 PM 07/02/2020    3:15 PM  GAD 7 : Generalized Anxiety Score  Nervous, Anxious, on Edge 0 0 0 0  Control/stop worrying 0 0 0 0  Worry too much - different things 0 0 0 0  Trouble relaxing 0 0 0 0  Restless 0 0 0 0  Easily annoyed or irritable 0 0 0 0  Afraid - awful might happen 0 0 0 0  Total GAD 7 Score 0 0 0 0   Anxiety Difficulty Not difficult at all Not difficult at all Not difficult at all Not difficult at all    ASSESSMENT/PLAN:  Acute recurrent pansinusitis Assessment & Plan: Symptoms of sinus congestion, pressure, and postnasal drainage for 5 days. No fever. Increased coughing today, possibly related to postnasal drainage. Patient concerned for progression to lower respiratory tract given history of similar presentation in April. -Provide prescription for Augmentin to be started if symptoms worsen or do not improve by 01/08/2023. -Advise to start probiotics with antibiotics if started, and continue for 2 weeks post-antibiotic treatment. -Provide prescription for dextromethorphan as needed for cough. -Advise use of honey cough drops and honey teas for symptomatic relief.  Orders: -     Amoxicillin-Pot Clavulanate; Take 1 tablet by mouth 2 (two) times daily for 5 days.  Dispense: 10 tablet; Refill: 0 -     Saccharomyces boulardii; Take 1 capsule (250 mg total) by mouth daily.  Dispense: 90 capsule; Refill: 0 -     Dextromethorphan HBr; Take 10 mLs (30 mg total) by mouth 4 (four) times daily as needed for cough.  Dispense: 120 mL; Refill: 1    PDMP reviewed  Return if symptoms worsen or fail to improve, for PCP.  Kevin Allan, MD

## 2023-01-06 NOTE — Patient Instructions (Signed)
It was a pleasure meeting you today. Thank you for allowing me to take part in your health care.  Our goals for today as we discussed include:  Start Augmentin 1 tablet 2 times a day for 5 days Start Probiotics daily and continue for 14 days after treatment  You can take Tylenol and/or Ibuprofen as needed for fever reduction and pain relief.   For cough: honey 1/2 to 1 teaspoon (you can dilute the honey in water or another fluid).  You can also use guaifenesin and dextromethorphan for cough. You can use a humidifier for chest congestion and cough.  If you don't have a humidifier, you can sit in the bathroom with the hot shower running.      For sore throat: try warm salt water gargles, cepacol lozenges, throat spray, warm tea or water with lemon/honey, popsicles or ice, or OTC cold relief medicine for throat discomfort.   For congestion: take a daily anti-histamine like Zyrtec, Claritin, and a oral decongestant, such as pseudoephedrine.  You can also use Flonase 1-2 sprays in each nostril daily.   It is important to stay hydrated: drink plenty of fluids (water, gatorade/powerade/pedialyte, juices, or teas) to keep your throat moisturized and help further relieve irritation/discomfort.   This is a list of the screening recommended for you and due dates:  Health Maintenance  Topic Date Due   COVID-19 Vaccine (1) Never done   Zoster (Shingles) Vaccine (1 of 2) Never done   Flu Shot  08/18/2022   Colon Cancer Screening  03/26/2026   DTaP/Tdap/Td vaccine (3 - Td or Tdap) 03/14/2028   Hepatitis C Screening  Completed   HIV Screening  Completed   HPV Vaccine  Aged Out    If you have any questions or concerns, please do not hesitate to call the office at [phone].  I look forward to our next visit and until then take care and stay safe.  Regards,   Dana Allan, MD   [facility]

## 2023-01-09 DIAGNOSIS — M9913 Subluxation complex (vertebral) of lumbar region: Secondary | ICD-10-CM | POA: Diagnosis not present

## 2023-01-09 DIAGNOSIS — M5030 Other cervical disc degeneration, unspecified cervical region: Secondary | ICD-10-CM | POA: Diagnosis not present

## 2023-01-09 DIAGNOSIS — M9911 Subluxation complex (vertebral) of cervical region: Secondary | ICD-10-CM | POA: Diagnosis not present

## 2023-01-11 ENCOUNTER — Encounter: Payer: Self-pay | Admitting: Family Medicine

## 2023-01-11 DIAGNOSIS — J0141 Acute recurrent pansinusitis: Secondary | ICD-10-CM | POA: Insufficient documentation

## 2023-01-11 NOTE — Assessment & Plan Note (Addendum)
Symptoms of sinus congestion, pressure, and postnasal drainage for 5 days. No fever. Increased coughing today, possibly related to postnasal drainage. Patient concerned for progression to lower respiratory tract given history of similar presentation in April. -Provide prescription for Augmentin to be started if symptoms worsen or do not improve by 01/08/2023. -Advise to start probiotics with antibiotics if started, and continue for 2 weeks post-antibiotic treatment. -Provide prescription for dextromethorphan as needed for cough. -Advise use of honey cough drops and honey teas for symptomatic relief.

## 2023-01-16 DIAGNOSIS — M9911 Subluxation complex (vertebral) of cervical region: Secondary | ICD-10-CM | POA: Diagnosis not present

## 2023-01-16 DIAGNOSIS — M9913 Subluxation complex (vertebral) of lumbar region: Secondary | ICD-10-CM | POA: Diagnosis not present

## 2023-01-16 DIAGNOSIS — M5030 Other cervical disc degeneration, unspecified cervical region: Secondary | ICD-10-CM | POA: Diagnosis not present

## 2023-02-24 ENCOUNTER — Encounter: Payer: Self-pay | Admitting: Family Medicine

## 2023-03-03 ENCOUNTER — Ambulatory Visit: Payer: BC Managed Care – PPO

## 2023-03-03 ENCOUNTER — Encounter: Payer: Self-pay | Admitting: Family Medicine

## 2023-03-03 ENCOUNTER — Ambulatory Visit: Payer: BC Managed Care – PPO | Admitting: Family Medicine

## 2023-03-03 VITALS — BP 108/60 | HR 79 | Temp 98.3°F | Resp 18 | Ht 73.0 in | Wt 204.1 lb

## 2023-03-03 DIAGNOSIS — M48061 Spinal stenosis, lumbar region without neurogenic claudication: Secondary | ICD-10-CM | POA: Diagnosis not present

## 2023-03-03 DIAGNOSIS — M545 Low back pain, unspecified: Secondary | ICD-10-CM | POA: Diagnosis not present

## 2023-03-03 DIAGNOSIS — R350 Frequency of micturition: Secondary | ICD-10-CM

## 2023-03-03 DIAGNOSIS — Z8739 Personal history of other diseases of the musculoskeletal system and connective tissue: Secondary | ICD-10-CM | POA: Diagnosis not present

## 2023-03-03 DIAGNOSIS — M47816 Spondylosis without myelopathy or radiculopathy, lumbar region: Secondary | ICD-10-CM | POA: Diagnosis not present

## 2023-03-03 MED ORDER — BACLOFEN 5 MG PO TABS: 5.0000 mg | ORAL_TABLET | Freq: Three times a day (TID) | ORAL | 0 refills | Status: AC

## 2023-03-03 MED ORDER — PREDNISONE 20 MG PO TABS: 20.0000 mg | ORAL_TABLET | Freq: Every day | ORAL | 0 refills | Status: AC

## 2023-03-03 NOTE — Progress Notes (Signed)
 SUBJECTIVE:   Chief Complaint  Patient presents with   Back Pain    Lower left X 3 month going from sitting to stand is when it is worse. Dull ache pain.   HPI Presents for acute visit  Discussed the use of AI scribe software for clinical note transcription with the patient, who gave verbal consent to proceed.  History of Present Illness Kevin Ward "Dannielle Huh" is a 58 year old male who presents with lower back pain.  He has been experiencing lower back pain for approximately three months, localized to the lower left side of his back. The pain is described as a dull, nagging toothache-like sensation that comes and goes, particularly when transitioning from sitting to standing, standing in one spot for three to five minutes, or beginning to walk after standing. He notes a 'little pop' sensation in his lower back when twisting in certain ways, such as when putting on shoes. No fever, blood in urine, or loss of bladder or bowel control. No worsening numbness or tingling aside from his known neuropathy. He has not had any recent falls or injuries.  He has been receiving treatment from a chiropractor, primarily for neuropathy, which included sitting in a chair with infrared heat that stretches the spine. Initially, this seemed to help, particularly with sciatic symptoms, which have since improved with morning stretches. However, the current issue is distinct from the previous sciatic pain.  He has not undergone any recent imaging studies for this issue. 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  He is not currently taking any medication for the pain but uses heat therapy, including infrared lights, which seems to provide some relief. He has meloxicam at home but prefers not to take it regularly.  He reports urinating more frequently at times but does not  associate it with increased fluid intake or activity.  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 He has been going to the gym for over a year, focusing on balance and stability exercises with a trainer. He has adjusted his habits, such as moving his wallet to the front pocket, to alleviate hip pain.    PERTINENT PMH / PSH: As above  OBJECTIVE:  BP 108/60   Pulse 79   Temp 98.3 F (36.8 C)   Resp 18   Ht 6\' 1"  (1.854 m)   Wt 204 lb 2 oz (92.6 kg)   SpO2 97%   BMI 26.93 kg/m    Physical Exam Constitutional:      General: He is not in acute distress.    Appearance: He is normal weight. He is not ill-appearing.  HENT:     Head: Normocephalic.  Eyes:     Conjunctiva/sclera: Conjunctivae normal.  Cardiovascular:     Rate and Rhythm: Normal rate and regular rhythm.     Pulses: Normal pulses.  Pulmonary:     Effort: Pulmonary effort is normal.     Breath sounds: Normal breath sounds.  Abdominal:     General: Bowel sounds are normal.  Musculoskeletal:     Cervical back: Normal.     Thoracic back: Normal. No tenderness. Normal range of motion.     Lumbar back: Spasms present. No swelling, edema (pain elicited on passive forward flexion), deformity, tenderness or bony tenderness. Negative right straight  leg raise test and negative left straight leg raise test. No scoliosis.  Neurological:     Mental Status: He is alert. Mental status is at baseline.  Psychiatric:        Mood and Affect: Mood normal.        Behavior: Behavior normal.        Thought Content: Thought content normal.        Judgment: Judgment normal.           03/03/2023    1:41 PM 01/06/2023    1:43 PM 07/18/2022    1:41 PM 06/01/2022   12:05 PM 05/09/2022    3:52 PM  Depression screen PHQ 2/9  Decreased Interest 0 0 0 0 0  Down, Depressed, Hopeless 0 0 0 0   PHQ - 2 Score 0 0 0 0 0  Altered sleeping 0 0 0 0   Tired, decreased energy 0 0 0 0   Change in appetite 0 0 0 0    Feeling bad or failure about yourself  0 0 0 0   Trouble concentrating 0 0 0 0   Moving slowly or fidgety/restless 0 0 0 0   Suicidal thoughts 0 0 0 0   PHQ-9 Score 0 0 0 0   Difficult doing work/chores Not difficult at all Not difficult at all Not difficult at all Not difficult at all       03/03/2023    1:41 PM 01/06/2023    1:43 PM 07/18/2022    1:42 PM 06/01/2022   12:06 PM  GAD 7 : Generalized Anxiety Score  Nervous, Anxious, on Edge 0 0 0 0  Control/stop worrying 0 0 0 0  Worry too much - different things 0 0 0 0  Trouble relaxing 0 0 0 0  Restless 0 0 0 0  Easily annoyed or irritable 0 0 0 0  Afraid - awful might happen 0 0 0 0  Total GAD 7 Score 0 0 0 0  Anxiety Difficulty Not difficult at all Not difficult at all Not difficult at all Not difficult at all    ASSESSMENT/PLAN:  Acute left-sided low back pain without sciatica Assessment & Plan: Chronic, localized lower left back pain with a history of sciatica. Pain is exacerbated by certain movements and prolonged standing. No radicular symptoms, no urinary or bowel incontinence, no fever. 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 No red flags. -Order lumbar spine X-ray to evaluate for possible arthritis or other structural abnormalities. -Start a 5-day course of steroids to reduce inflammation. -Continue with heat therapy and gentle stretching at home. -Avoid strenuous activities and heavy lifting. -Consider physical therapy once inflammation is controlled, depending on the results of the X-ray.   Orders: -     DG Lumbar Spine Complete -     predniSONE; Take 1 tablet (20 mg total) by mouth daily with breakfast for 5 days.  Dispense: 5 tablet; Refill: 0 -     Baclofen; Take 1 tablet (5 mg total) by mouth 3 (three) times daily for 10 days.  Dispense: 30 tablet; Refill: 0  Urinary frequency Assessment & Plan: Reports increased frequency of urination, but no dysuria, hematuria, or incontinence. Recent  PSA has been normal. -Decrease caffeine products.   -Limit fluid intake 2-3 hours prior to sleep.  Monitor fluid intake. -Continue to monitor symptoms. If symptoms persist or worsen, consider further evaluation.     PDMP reviewed  Return if symptoms worsen or fail  to improve, for PCP.  Dana Allan, MD

## 2023-03-03 NOTE — Patient Instructions (Addendum)
It was a pleasure meeting you today. Thank you for allowing me to take part in your health care.  Our goals for today as we discussed include:  Xray of back today.  Start Prednisone 20 mg daily for 5 days Start Baclofen 5 mg three times a day for muscle spasms Tylenol 500 mg every 6 hours as needed Lidocaine patch every 12 hours as needed Diclofenac gel every 6 hours as needed Heat/Ice as needed Massage therapy Gentle low back stretching daily Can refer to physical therapy if no improvement    Follow up with PCP if no improvement   This is a list of the screening recommended for you and due dates:  Health Maintenance  Topic Date Due   COVID-19 Vaccine (1) Never done   Zoster (Shingles) Vaccine (1 of 2) 04/06/2023*   Flu Shot  04/17/2023*   Colon Cancer Screening  03/26/2026   DTaP/Tdap/Td vaccine (3 - Td or Tdap) 03/14/2028   Hepatitis C Screening  Completed   HIV Screening  Completed   HPV Vaccine  Aged Out  *Topic was postponed. The date shown is not the original due date.     If you have any questions or concerns, please do not hesitate to call the office at [phone].  I look forward to our next visit and until then take care and stay safe.  Regards,   Dana Allan, MD   [facility]

## 2023-03-12 ENCOUNTER — Encounter: Payer: Self-pay | Admitting: Family Medicine

## 2023-03-12 DIAGNOSIS — R350 Frequency of micturition: Secondary | ICD-10-CM | POA: Insufficient documentation

## 2023-03-12 DIAGNOSIS — M545 Low back pain, unspecified: Secondary | ICD-10-CM | POA: Insufficient documentation

## 2023-03-12 NOTE — Assessment & Plan Note (Addendum)
 Reports increased frequency of urination, but no dysuria, hematuria, or incontinence. Recent PSA has been normal. -Decrease caffeine products.   -Limit fluid intake 2-3 hours prior to sleep.  Monitor fluid intake. -Continue to monitor symptoms. If symptoms persist or worsen, consider further evaluation.

## 2023-03-12 NOTE — Assessment & Plan Note (Signed)
 Chronic, localized lower left back pain with a history of sciatica. Pain is exacerbated by certain movements and prolonged standing. No radicular symptoms, no urinary or bowel incontinence, no fever. 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 No red flags. -Order lumbar spine X-ray to evaluate for possible arthritis or other structural abnormalities. -Start a 5-day course of steroids to reduce inflammation. -Continue with heat therapy and gentle stretching at home. -Avoid strenuous activities and heavy lifting. -Consider physical therapy once inflammation is controlled, depending on the results of the X-ray.

## 2023-03-16 DIAGNOSIS — M4726 Other spondylosis with radiculopathy, lumbar region: Secondary | ICD-10-CM | POA: Diagnosis not present

## 2023-03-20 IMAGING — DX DG THORACIC SPINE 2V
3 series · 3 of 3 positions shown · non-contrast
Comparison: CT 04/21/2020.

CLINICAL DATA: T6 sclerosis noted on CT.

EXAM:
THORACIC SPINE 2 VIEWS

[thoracic spine ap]
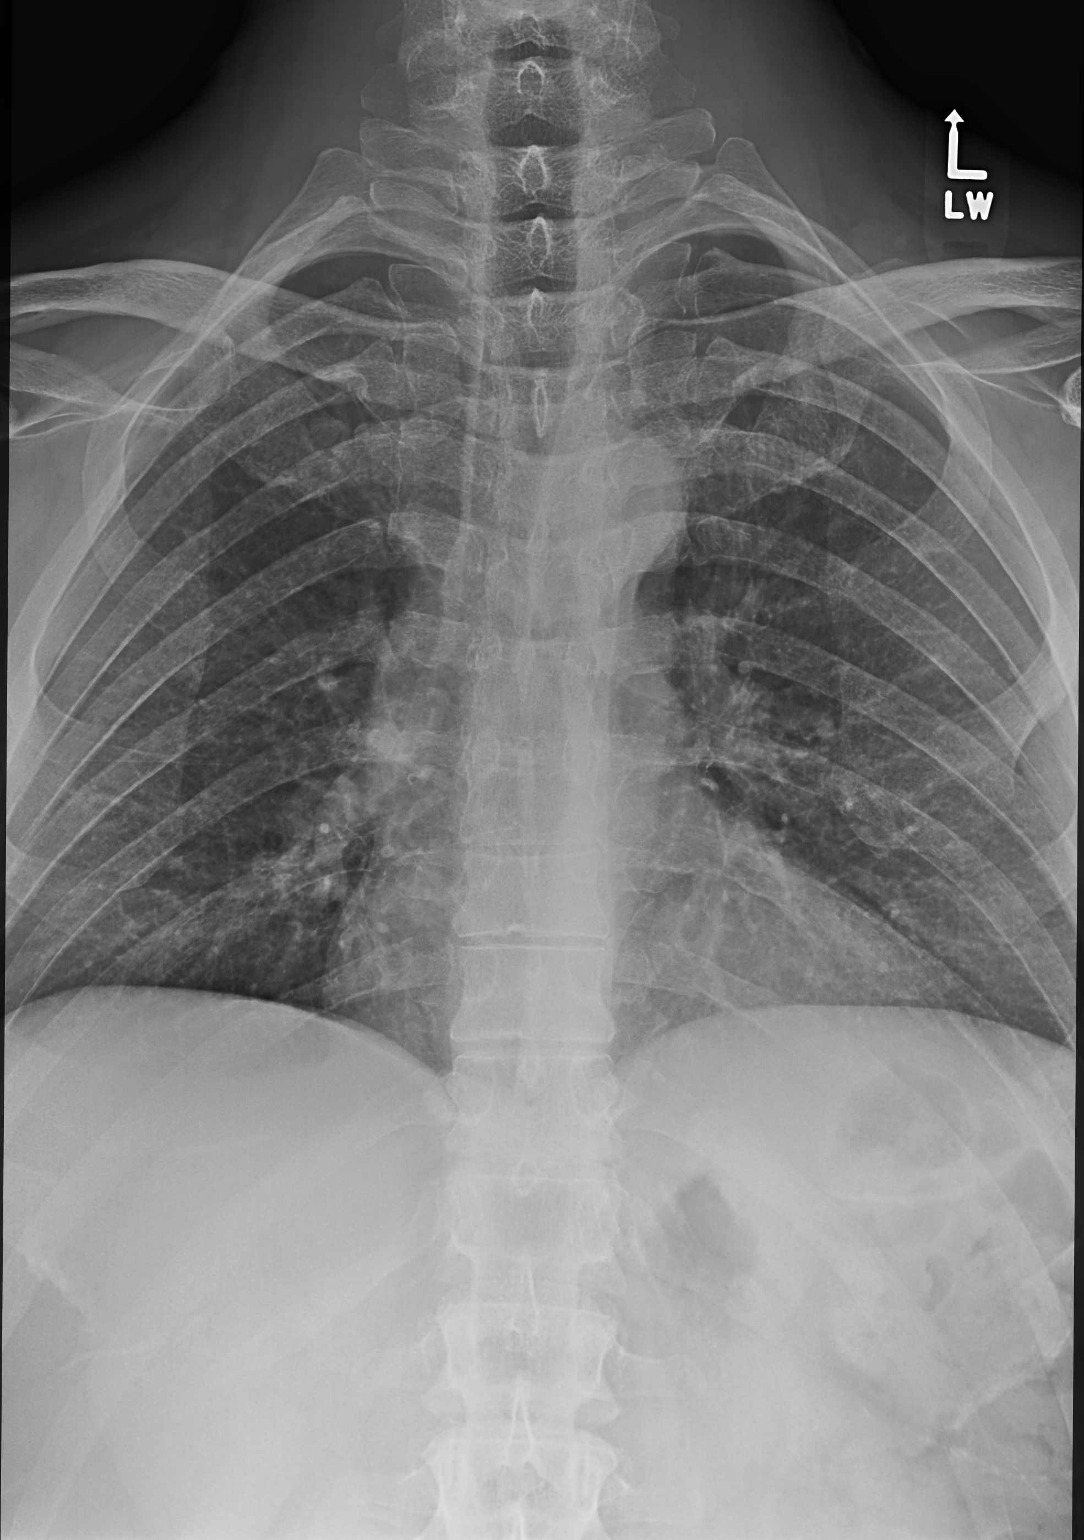

[thoracic spine lat]
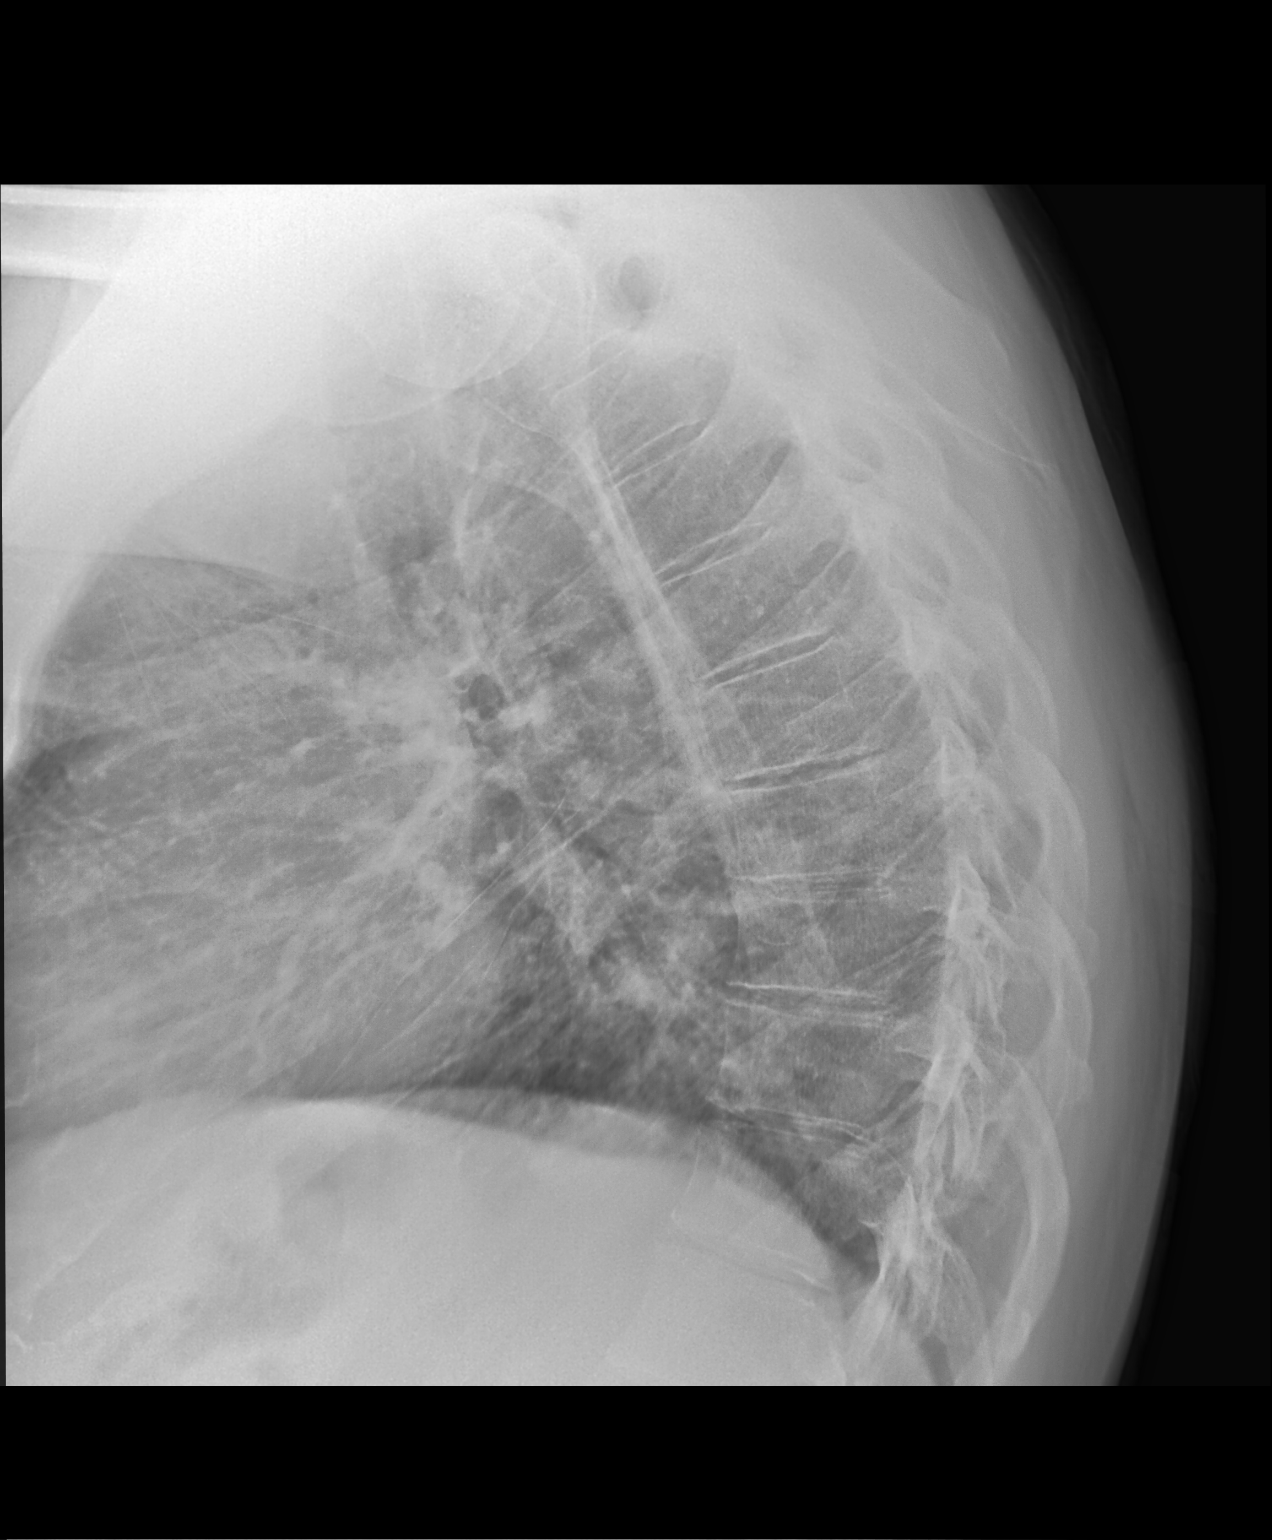

[swimmers lat]
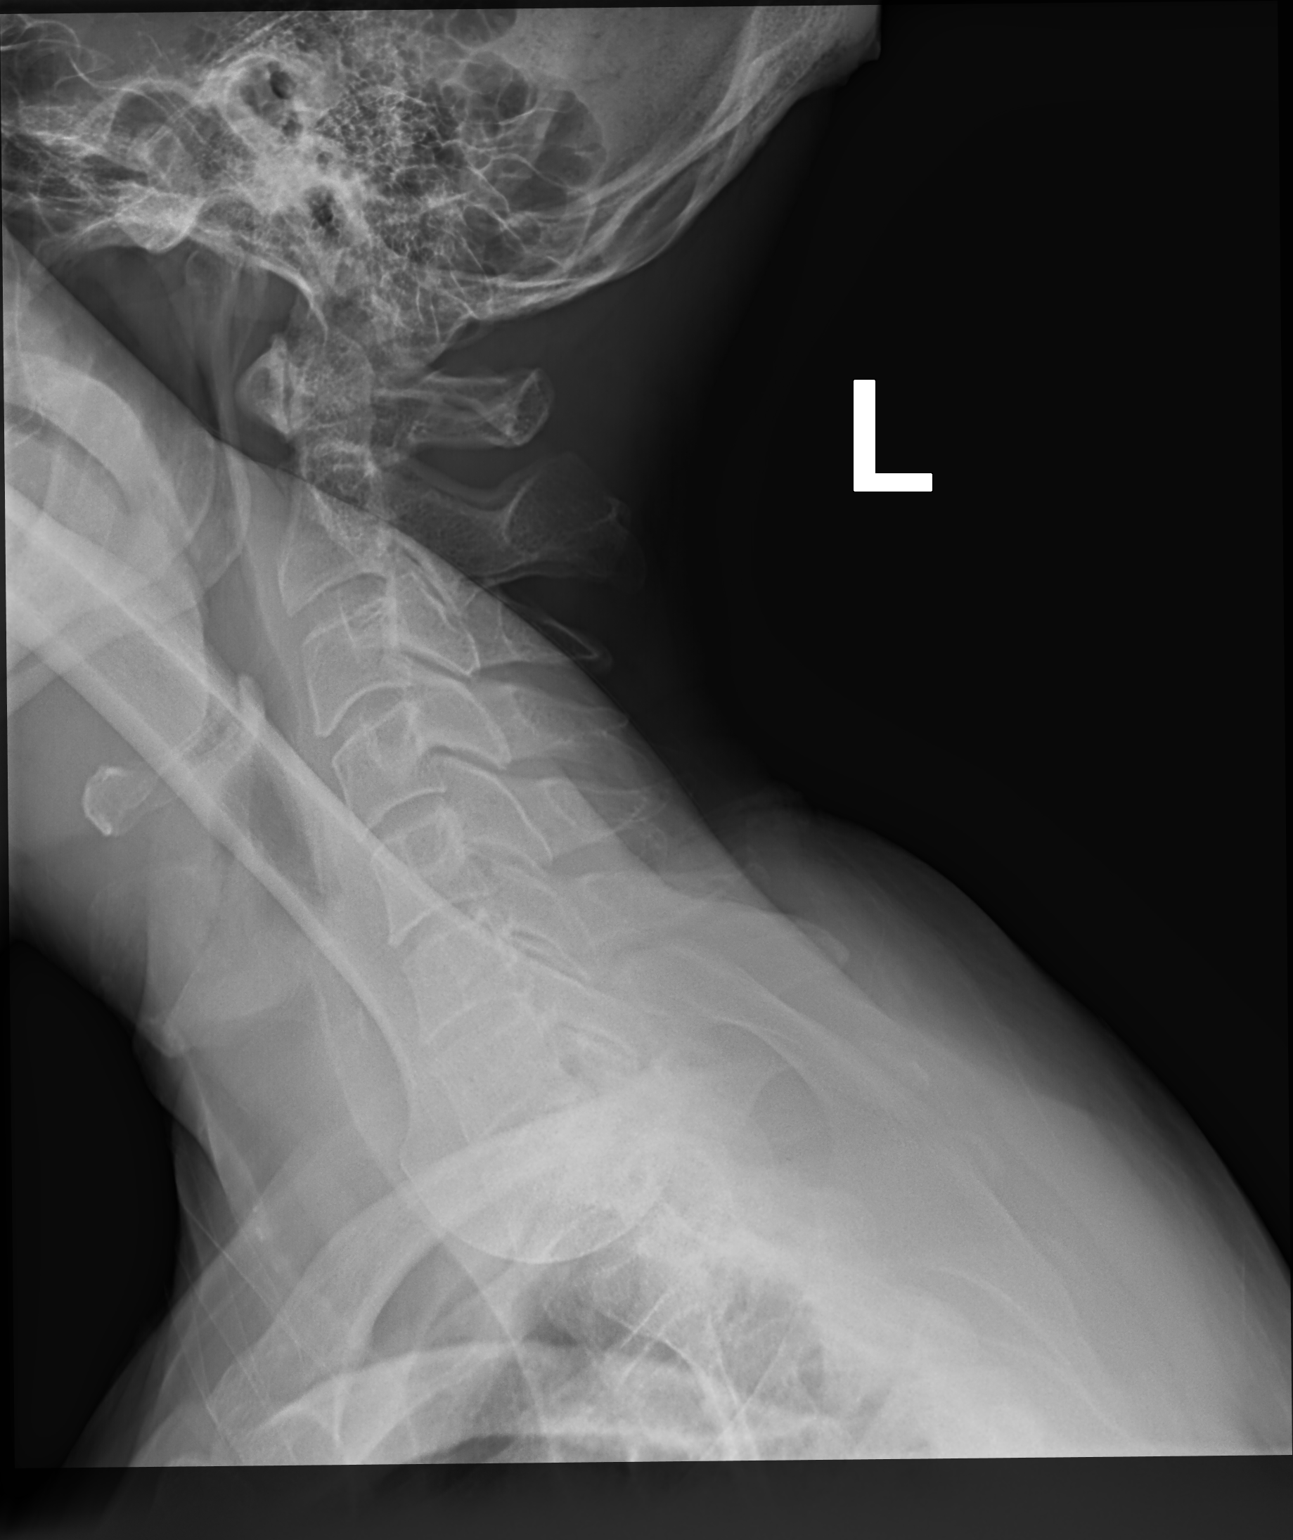

[3 of 3 positions shown; findings below may reference images not displayed]

FINDINGS: Paraspinal soft tissues are unremarkable. Mild degenerative change.
Sclerotic focus in T6 best identified by prior CT. To further
evaluate MRI of the thoracic spine can be obtained. No acute
abnormality identified.
IMPRESSION: 1. Mild degenerative change. Sclerotic focus in T6 best identified
by prior CT. To further evaluate MRI of the thoracic spine can be
obtained.

2.  No acute bony abnormality.

## 2023-03-29 DIAGNOSIS — M545 Low back pain, unspecified: Secondary | ICD-10-CM | POA: Diagnosis not present

## 2023-04-03 DIAGNOSIS — M545 Low back pain, unspecified: Secondary | ICD-10-CM | POA: Diagnosis not present

## 2023-04-11 DIAGNOSIS — M545 Low back pain, unspecified: Secondary | ICD-10-CM | POA: Diagnosis not present

## 2023-04-13 ENCOUNTER — Telehealth: Payer: Self-pay | Admitting: Family Medicine

## 2023-04-13 NOTE — Telephone Encounter (Signed)
 Dr Clent Ridges is leaving the practice and your appointment on 08/01/2023 needs to be rescheduled with another provider. Please call the office to reschedule your Transfer of Care to either Dr Charlann Lange, MD, Darleen Crocker or Kara Dies, NP.  E2C2 Please schedule

## 2023-04-14 DIAGNOSIS — M545 Low back pain, unspecified: Secondary | ICD-10-CM | POA: Diagnosis not present

## 2023-04-18 DIAGNOSIS — M545 Low back pain, unspecified: Secondary | ICD-10-CM | POA: Diagnosis not present

## 2023-04-21 DIAGNOSIS — M545 Low back pain, unspecified: Secondary | ICD-10-CM | POA: Diagnosis not present

## 2023-04-25 DIAGNOSIS — M545 Low back pain, unspecified: Secondary | ICD-10-CM | POA: Diagnosis not present

## 2023-04-27 DIAGNOSIS — M545 Low back pain, unspecified: Secondary | ICD-10-CM | POA: Diagnosis not present

## 2023-05-03 DIAGNOSIS — M545 Low back pain, unspecified: Secondary | ICD-10-CM | POA: Diagnosis not present

## 2023-05-04 IMAGING — NM NM BONE WHOLE BODY
2 series · 6 of 6 positions shown · non-contrast
Comparison: MRI 05/21/2020

CLINICAL DATA: Abnormal thoracic spine MRI, multiple indeterminate
osseous sclerotic lesions.

EXAM:
NUCLEAR MEDICINE WHOLE BODY BONE SCAN
TECHNIQUE: Whole body anterior and posterior images were obtained approximately
3 hours after intravenous injection of radiopharmaceutical.
RADIOPHARMACEUTICALS:  21.49 mCi Wechnetium-77m MDP IV

[Series 1000: 3 hr wholebody · 2.40mm/px · 2 of 2 frames shown]
[frame 1/2]
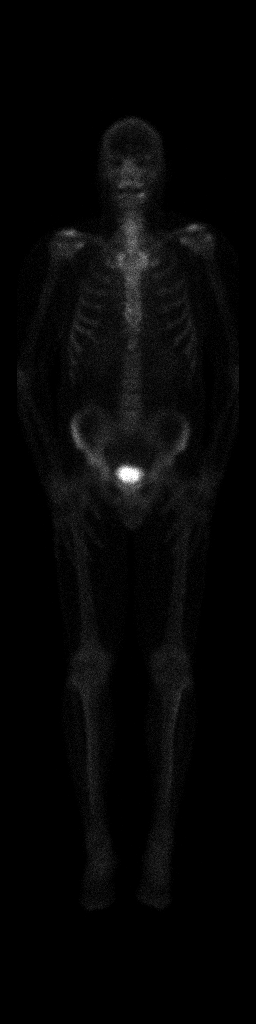
[frame 2/2]
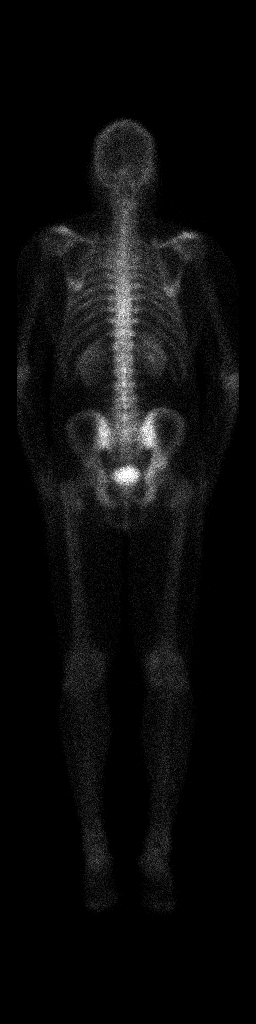

[Series 1000: statics · 2.40mm/px · 2 acquisitions, 4 frames shown]
[im 1/2]
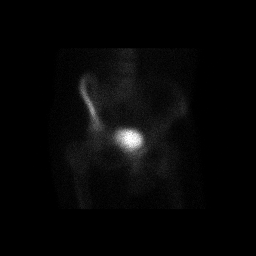
[im 1/2]
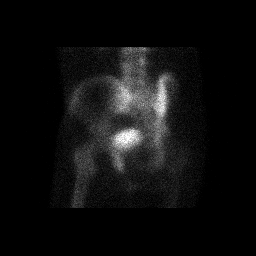
[im 2/2]
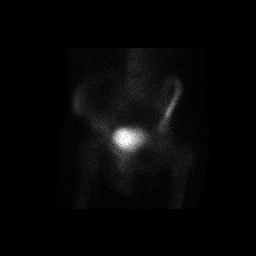
[im 2/2]
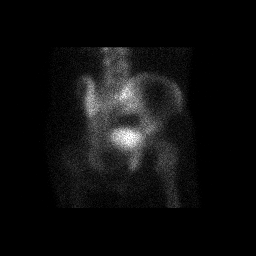

[6 of 6 positions shown; findings below may reference images not displayed]

FINDINGS: Whole body delayed scintigraphic images were obtained. There is no
scintigraphic correlate for the indeterminate lesions identified
within the thoracic spine at T6 and T8 on prior MRI examination,
though their relatively small size may limit detection. Uptake
within the shoulders, sternum and sternoclavicular joints
bilaterally are likely degenerative in nature. Uptake within the
mandible likely relates to periodontal disease.
IMPRESSION: No scintigraphic correlate for the lesions identified on prior
thoracic spine MRI, though their relatively small size may limit
detection on this examination. No suspicious foci of uptake within
the visualized axial and appendicular skeleton.

## 2023-05-11 DIAGNOSIS — M545 Low back pain, unspecified: Secondary | ICD-10-CM | POA: Diagnosis not present

## 2023-05-15 DIAGNOSIS — M545 Low back pain, unspecified: Secondary | ICD-10-CM | POA: Diagnosis not present

## 2023-05-18 ENCOUNTER — Ambulatory Visit: Payer: BC Managed Care – PPO | Admitting: Dermatology

## 2023-05-18 DIAGNOSIS — M4726 Other spondylosis with radiculopathy, lumbar region: Secondary | ICD-10-CM | POA: Diagnosis not present

## 2023-05-29 ENCOUNTER — Encounter: Payer: Self-pay | Admitting: Dermatology

## 2023-05-29 ENCOUNTER — Ambulatory Visit: Admitting: Dermatology

## 2023-05-29 DIAGNOSIS — D229 Melanocytic nevi, unspecified: Secondary | ICD-10-CM | POA: Diagnosis not present

## 2023-05-29 DIAGNOSIS — Z1283 Encounter for screening for malignant neoplasm of skin: Secondary | ICD-10-CM

## 2023-05-29 DIAGNOSIS — L578 Other skin changes due to chronic exposure to nonionizing radiation: Secondary | ICD-10-CM | POA: Diagnosis not present

## 2023-05-29 DIAGNOSIS — L821 Other seborrheic keratosis: Secondary | ICD-10-CM

## 2023-05-29 DIAGNOSIS — L820 Inflamed seborrheic keratosis: Secondary | ICD-10-CM

## 2023-05-29 DIAGNOSIS — Z86018 Personal history of other benign neoplasm: Secondary | ICD-10-CM

## 2023-05-29 DIAGNOSIS — Z7189 Other specified counseling: Secondary | ICD-10-CM

## 2023-05-29 DIAGNOSIS — D492 Neoplasm of unspecified behavior of bone, soft tissue, and skin: Secondary | ICD-10-CM

## 2023-05-29 DIAGNOSIS — Z85828 Personal history of other malignant neoplasm of skin: Secondary | ICD-10-CM

## 2023-05-29 DIAGNOSIS — L814 Other melanin hyperpigmentation: Secondary | ICD-10-CM | POA: Diagnosis not present

## 2023-05-29 NOTE — Progress Notes (Signed)
 Follow-Up Visit   Subjective  Kevin Ward is a 58 y.o. male who presents for the following: Skin Cancer Screening and Full Body Skin Exam, hx of BCC, hx of Dysplastic nevus.  Patient c/o growth on his forehead that is some days more noticeable than other days, growth appeared ~4 years ago near a scar, scar from previous BCC removed several years ago.    The patient presents for Total-Body Skin Exam (TBSE) for skin cancer screening and mole check. The patient has spots, moles and lesions to be evaluated, some may be new or changing and the patient may have concern these could be cancer.  The following portions of the chart were reviewed this encounter and updated as appropriate: medications, allergies, medical history  Review of Systems:  No other skin or systemic complaints except as noted in HPI or Assessment and Plan.  Objective  Well appearing patient in no apparent distress; mood and affect are within normal limits.  A full examination was performed including scalp, head, eyes, ears, nose, lips, neck, chest, axillae, abdomen, back, buttocks, bilateral upper extremities, bilateral lower extremities, hands, feet, fingers, toes, fingernails, and toenails. All findings within normal limits unless otherwise noted below.   Relevant physical exam findings are noted in the Assessment and Plan.  right anterior scalp Stuck-on, waxy, tan-brown papules and plaques -- Discussed benign etiology and prognosis.  right forehead above brow 1.0 cm flesh papule    Assessment & Plan   SKIN CANCER SCREENING PERFORMED TODAY.  ACTINIC DAMAGE - Chronic condition, secondary to cumulative UV/sun exposure - diffuse scaly erythematous macules with underlying dyspigmentation - Recommend daily broad spectrum sunscreen SPF 30+ to sun-exposed areas, reapply every 2 hours as needed.  - Staying in the shade or wearing long sleeves, sun glasses (UVA+UVB protection) and wide brim hats (4-inch brim around the  entire circumference of the hat) are also recommended for sun protection.  - Call for new or changing lesions.  LENTIGINES, SEBORRHEIC KERATOSES, HEMANGIOMAS - Benign normal skin lesions - Benign-appearing - Call for any changes  MELANOCYTIC NEVI - Tan-brown and/or pink-flesh-colored symmetric macules and papules - Benign appearing on exam today - Observation - Call clinic for new or changing moles - Recommend daily use of broad spectrum spf 30+ sunscreen to sun-exposed areas.   HISTORY OF BASAL CELL CARCINOMA OF THE SKIN Multiple locations see history - No evidence of recurrence today - Recommend regular full body skin exams - Recommend daily broad spectrum sunscreen SPF 30+ to sun-exposed areas, reapply every 2 hours as needed.  - Call if any new or changing lesions are noted between office visits   HISTORY OF DYSPLASTIC NEVUS Multiple locations see history  No evidence of recurrence today Recommend regular full body skin exams Recommend daily broad spectrum sunscreen SPF 30+ to sun-exposed areas, reapply every 2 hours as needed.  Call if any new or changing lesions are noted between office visits  INFLAMED SEBORRHEIC KERATOSIS right anterior scalp Symptomatic, irritating, patient would like treated.  Destruction of lesion - right anterior scalp Complexity: simple   Destruction method: cryotherapy   Informed consent: discussed and consent obtained   Timeout:  patient name, date of birth, surgical site, and procedure verified Lesion destroyed using liquid nitrogen: Yes   Region frozen until ice ball extended beyond lesion: Yes   Outcome: patient tolerated procedure well with no complications   Post-procedure details: wound care instructions given   NEOPLASM OF SKIN right forehead above brow Likely a benign  cyst.(Want to make sure not related to [facility] close to area in past) Recommend surgery with Dr Fain Home   Return in about 1 year (around 05/28/2024) for TBSE, hx of BCC, hx  of Dysplastic nevus .  IClara Crisp, CMA, am acting as scribe for Celine Collard, MD .   Documentation: I have reviewed the above documentation for accuracy and completeness, and I agree with the above.  Celine Collard, MD

## 2023-05-29 NOTE — Patient Instructions (Addendum)

## 2023-05-30 ENCOUNTER — Telehealth: Payer: Self-pay

## 2023-05-30 DIAGNOSIS — M47896 Other spondylosis, lumbar region: Secondary | ICD-10-CM | POA: Diagnosis not present

## 2023-05-30 DIAGNOSIS — D492 Neoplasm of unspecified behavior of bone, soft tissue, and skin: Secondary | ICD-10-CM

## 2023-05-30 NOTE — Telephone Encounter (Signed)
 Referral to Dr. Paci for excision. aw

## 2023-06-09 DIAGNOSIS — M5416 Radiculopathy, lumbar region: Secondary | ICD-10-CM | POA: Diagnosis not present

## 2023-06-19 ENCOUNTER — Encounter: Payer: Self-pay | Admitting: Dermatology

## 2023-06-21 ENCOUNTER — Encounter: Payer: Self-pay | Admitting: Dermatology

## 2023-06-22 ENCOUNTER — Ambulatory Visit: Admitting: Dermatology

## 2023-06-27 ENCOUNTER — Ambulatory Visit: Admitting: Dermatology

## 2023-07-07 DIAGNOSIS — M5416 Radiculopathy, lumbar region: Secondary | ICD-10-CM | POA: Diagnosis not present

## 2023-07-25 ENCOUNTER — Other Ambulatory Visit: Payer: BC Managed Care – PPO

## 2023-08-01 ENCOUNTER — Encounter: Payer: BC Managed Care – PPO | Admitting: Family Medicine

## 2023-08-04 DIAGNOSIS — M545 Low back pain, unspecified: Secondary | ICD-10-CM | POA: Diagnosis not present

## 2023-08-04 DIAGNOSIS — G8929 Other chronic pain: Secondary | ICD-10-CM | POA: Diagnosis not present

## 2023-08-14 ENCOUNTER — Ambulatory Visit: Admitting: Dermatology

## 2023-08-14 ENCOUNTER — Encounter: Payer: Self-pay | Admitting: Dermatology

## 2023-08-14 VITALS — BP 154/80 | HR 68 | Temp 98.6°F

## 2023-08-14 DIAGNOSIS — D485 Neoplasm of uncertain behavior of skin: Secondary | ICD-10-CM

## 2023-08-14 DIAGNOSIS — D492 Neoplasm of unspecified behavior of bone, soft tissue, and skin: Secondary | ICD-10-CM | POA: Diagnosis not present

## 2023-08-14 DIAGNOSIS — D360 Benign neoplasm of lymph nodes: Secondary | ICD-10-CM | POA: Diagnosis not present

## 2023-08-14 NOTE — Progress Notes (Signed)
 Follow-Up Visit   Subjective  Kevin Ward is a 58 y.o. male who presents for the following: Excision of a Neoplasm of skin on the right forehead, referred by Dr. Hester. Adjacent a previous BCC scar.  The following portions of the chart were reviewed this encounter and updated as appropriate: medications, allergies, medical history  Review of Systems:  No other skin or systemic complaints except as noted in HPI or Assessment and Plan.  Objective  Well appearing patient in no apparent distress; mood and affect are within normal limits.  A focused examination was performed of the following areas: Right forehead Relevant physical exam findings are noted in the Assessment and Plan.   Right Forehead Subcutaneous nodule   Assessment & Plan   NEOPLASM OF UNCERTAIN BEHAVIOR OF SKIN Right Forehead Skin excision  Excision method:  elliptical Lesion length (cm):  0.5 Lesion width (cm):  0.4 Margin per side (cm):  0.1 Total excision diameter (cm):  0.7 Informed consent: discussed and consent obtained   Timeout: patient name, date of birth, surgical site, and procedure verified   Procedure prep:  Patient was prepped and draped in usual sterile fashion Prep type:  Chlorhexidine Anesthesia: the lesion was anesthetized in a standard fashion   Anesthetic:  1% lidocaine w/ epinephrine 1-100,000 buffered w/ 8.4% NaHCO3 Instrument used: #15 blade   Hemostasis achieved with: suture, pressure and electrodesiccation   Outcome: patient tolerated procedure well with no complications   Post-procedure details: sterile dressing applied and wound care instructions given   Dressing type: bandage, pressure dressing and petrolatum    Skin repair Complexity:  Complex Final length (cm):  2.1 Informed consent: discussed and consent obtained   Timeout: patient name, date of birth, surgical site, and procedure verified   Procedure prep:  Patient was prepped and draped in usual sterile  fashion Prep type:  Chlorhexidine Anesthesia: the lesion was anesthetized in a standard fashion   Anesthetic:  1% lidocaine w/ epinephrine 1-100,000 buffered w/ 8.4% NaHCO3 Reason for type of repair: reduce tension to allow closure, allow closure of the large defect, preserve normal anatomy and preserve normal anatomical and functional relationships   Undermining: area extensively undermined   Subcutaneous layers (deep stitches):  Suture size:  5-0 Suture type: Monocryl (poliglecaprone 25)   Stitches:  Buried vertical mattress Fine/surface layer approximation (top stitches):  Suture size:  6-0 Suture type: fast-absorbing plain gut   Stitches: simple running   Hemostasis achieved with: suture, pressure and electrodesiccation Outcome: patient tolerated procedure well with no complications   Post-procedure details: sterile dressing applied and wound care instructions given   Dressing type: bacitracin, bandage and pressure dressing    Specimen 1 - Surgical pathology Differential Diagnosis: R/O cyst vs lipoma vs other  Check Margins: No  The surgical wound was then cleaned, prepped, and re-anesthetized as above. Wound edges were undermined extensively along at least one entire edge and at a distance equal to or greater than the width of the defect (see wound defect size above) in order to achieve closure and decrease wound tension and anatomic distortion. Redundant tissue repair including standing cone removal was performed. Hemostasis was achieved with electrocautery. Subcutaneous and epidermal tissues were approximated with the above sutures. The surgical site was then lightly scrubbed with sterile, saline-soaked gauze. The area was then bandaged using Vaseline ointment, non-adherent gauze, gauze pads, and tape to provide an adequate pressure dressing. The patient tolerated the procedure well, was given detailed written and verbal wound care  instructions, and was discharged in good condition.    The patient will follow-up: PRN.  Return if symptoms worsen or fail to improve.  I, Berwyn Lesches, Surg Tech III, am acting as scribe for RUFUS CHRISTELLA HOLY, MD.   Documentation: I have reviewed the above documentation for accuracy and completeness, and I agree with the above.  RUFUS CHRISTELLA HOLY, MD

## 2023-08-14 NOTE — Patient Instructions (Signed)

## 2023-08-18 DIAGNOSIS — M5416 Radiculopathy, lumbar region: Secondary | ICD-10-CM | POA: Diagnosis not present

## 2023-08-21 ENCOUNTER — Ambulatory Visit: Payer: Self-pay | Admitting: Dermatology

## 2023-08-21 LAB — SURGICAL PATHOLOGY

## 2023-08-25 NOTE — Telephone Encounter (Signed)
 2nd attempted to reach out to pt to schedule TOC appointment.

## 2023-09-08 DIAGNOSIS — M5416 Radiculopathy, lumbar region: Secondary | ICD-10-CM | POA: Diagnosis not present

## 2023-11-10 DIAGNOSIS — Z1329 Encounter for screening for other suspected endocrine disorder: Secondary | ICD-10-CM | POA: Diagnosis not present

## 2023-11-10 DIAGNOSIS — E291 Testicular hypofunction: Secondary | ICD-10-CM | POA: Diagnosis not present

## 2023-11-10 DIAGNOSIS — Z125 Encounter for screening for malignant neoplasm of prostate: Secondary | ICD-10-CM | POA: Diagnosis not present

## 2023-11-16 DIAGNOSIS — G629 Polyneuropathy, unspecified: Secondary | ICD-10-CM | POA: Diagnosis not present

## 2023-11-16 DIAGNOSIS — N529 Male erectile dysfunction, unspecified: Secondary | ICD-10-CM | POA: Diagnosis not present

## 2023-11-16 DIAGNOSIS — M549 Dorsalgia, unspecified: Secondary | ICD-10-CM | POA: Diagnosis not present

## 2023-11-16 DIAGNOSIS — M2550 Pain in unspecified joint: Secondary | ICD-10-CM | POA: Diagnosis not present

## 2023-12-22 DIAGNOSIS — E291 Testicular hypofunction: Secondary | ICD-10-CM | POA: Diagnosis not present

## 2023-12-22 DIAGNOSIS — Z79890 Hormone replacement therapy (postmenopausal): Secondary | ICD-10-CM | POA: Diagnosis not present

## 2023-12-29 DIAGNOSIS — E291 Testicular hypofunction: Secondary | ICD-10-CM | POA: Diagnosis not present

## 2023-12-29 DIAGNOSIS — Z6827 Body mass index (BMI) 27.0-27.9, adult: Secondary | ICD-10-CM | POA: Diagnosis not present

## 2023-12-29 DIAGNOSIS — M2550 Pain in unspecified joint: Secondary | ICD-10-CM | POA: Diagnosis not present

## 2023-12-29 DIAGNOSIS — R5383 Other fatigue: Secondary | ICD-10-CM | POA: Diagnosis not present

## 2024-05-29 ENCOUNTER — Ambulatory Visit: Admitting: Dermatology
